# Patient Record
Sex: Female | Born: 1965 | Race: White | Hispanic: No | Marital: Married | Smoking: Never smoker
Health system: Southern US, Community
[De-identification: ages and names within clinical notes are randomized; demographics above are authoritative.]

## PROBLEM LIST (undated history)

## (undated) DIAGNOSIS — Z973 Presence of spectacles and contact lenses: Secondary | ICD-10-CM

## (undated) DIAGNOSIS — R112 Nausea with vomiting, unspecified: Secondary | ICD-10-CM

## (undated) DIAGNOSIS — J45909 Unspecified asthma, uncomplicated: Secondary | ICD-10-CM

## (undated) DIAGNOSIS — Z9889 Other specified postprocedural states: Secondary | ICD-10-CM

## (undated) DIAGNOSIS — Z8489 Family history of other specified conditions: Secondary | ICD-10-CM

## (undated) DIAGNOSIS — N920 Excessive and frequent menstruation with regular cycle: Secondary | ICD-10-CM

## (undated) DIAGNOSIS — Z8632 Personal history of gestational diabetes: Secondary | ICD-10-CM

## (undated) HISTORY — PX: CERVICAL BIOPSY  W/ LOOP ELECTRODE EXCISION: SUR135

## (undated) HISTORY — PX: VAGINAL HYSTERECTOMY: SUR661

---

## 1986-04-21 HISTORY — PX: KNEE ARTHROSCOPY: SUR90

## 1993-04-21 HISTORY — PX: WISDOM TOOTH EXTRACTION: SHX21

## 1995-04-22 HISTORY — PX: REDUCTION MAMMAPLASTY: SUR839

## 2013-03-04 ENCOUNTER — Ambulatory Visit (INDEPENDENT_AMBULATORY_CARE_PROVIDER_SITE_OTHER): Payer: BC Managed Care – PPO | Admitting: Family Medicine

## 2013-03-04 ENCOUNTER — Encounter: Payer: Self-pay | Admitting: Family Medicine

## 2013-03-04 VITALS — BP 111/75 | HR 101 | Ht 61.0 in | Wt 130.0 lb

## 2013-03-04 DIAGNOSIS — M2140 Flat foot [pes planus] (acquired), unspecified foot: Secondary | ICD-10-CM

## 2013-03-04 DIAGNOSIS — M722 Plantar fascial fibromatosis: Secondary | ICD-10-CM

## 2013-03-04 NOTE — Assessment & Plan Note (Signed)
Custom orthotics made today.  Patient will continue PT, home exercises and follow up with Dr. Charlett Blake.

## 2013-03-04 NOTE — Progress Notes (Signed)
Patient ID: Mackenzie Clayton, female   DOB: July 09, 1965, 47 y.o.   MRN: [id number] 47 year old female with a long history of plantar fasciitis currently treated by Dr. Delene Loll been going to physical therapy and was referred for orthotic production. Onset of symptoms 2002.  Use orthotics in the past, made in [place], which are worn-out. Currently doing stretching exercises with physical therapy as well as at home. Symptoms have not significantly worsened however they do limit her activity of late.  Pertinent past medical history:  Negative for diabetes.  Positive for plantar fascitis  Pertinent past surgical history: Negative for foot surgery.  Social history: Nonsmoker  Review of systems as per history of present illness otherwise negative  Examination: BP 111/75  Pulse 101  Ht 5\' 1"  (1.549 m)  Wt 130 lb (58.968 kg)  BMI 24.58 kg/m2 Well-developed well-nourished 47 year old female awake alert and oriented no acute distress  Leg length is equal   Bilateral feet:  Tender to palpation of the anterior calcaneus  No evidence of effusion the posterior calcaneus  No tenderness over the Achilles tendon insertion  Normal Thompson test  No midfoot tenderness  Neutral longitudinal arches with mild collapse on standing  Loss of the transverse arch   Patient was fitted for a : semi-rigid orthotic. The orthotic was heated and afterward the patient stood on the orthotic blank positioned on the orthotic stand. The patient was positioned in subtalar neutral position and 10 degrees of ankle dorsiflexion in a weight bearing stance. After completion of molding, a stable base was applied to the orthotic blank. The blank was ground to a stable position for weight bearing. Size:  6 red eva Base:  Blue EVA medium density Posting:  none Additional orthotic padding:  none  Greater than 40 minutes of time spent in obtaining history, examination and production of custom orthotics.

## 2013-04-01 ENCOUNTER — Other Ambulatory Visit: Payer: Self-pay

## 2013-04-01 DIAGNOSIS — Z1231 Encounter for screening mammogram for malignant neoplasm of breast: Secondary | ICD-10-CM

## 2013-05-09 ENCOUNTER — Ambulatory Visit: Payer: BC Managed Care – PPO

## 2013-06-09 ENCOUNTER — Other Ambulatory Visit: Payer: Self-pay

## 2013-06-09 DIAGNOSIS — Z1231 Encounter for screening mammogram for malignant neoplasm of breast: Secondary | ICD-10-CM

## 2013-06-09 DIAGNOSIS — Z803 Family history of malignant neoplasm of breast: Secondary | ICD-10-CM

## 2013-06-11 ENCOUNTER — Encounter (HOSPITAL_COMMUNITY): Payer: Self-pay | Admitting: Emergency Medicine

## 2013-06-11 ENCOUNTER — Emergency Department (HOSPITAL_COMMUNITY)
Admission: EM | Admit: 2013-06-11 | Discharge: 2013-06-11 | Disposition: A | Discharge status: Home / Self Care | Payer: BC Managed Care – PPO | Source: Home / Self Care | Attending: Emergency Medicine | Admitting: Emergency Medicine

## 2013-06-11 DIAGNOSIS — J069 Acute upper respiratory infection, unspecified: Secondary | ICD-10-CM

## 2013-06-11 MED ORDER — HYDROCOD POLST-CHLORPHEN POLST 10-8 MG/5ML PO LQCR: 5.0000 mL | Freq: Two times a day (BID) | ORAL | Status: DC | PRN

## 2013-06-11 MED ORDER — PREDNISONE 20 MG PO TABS: 20.0000 mg | ORAL_TABLET | Freq: Two times a day (BID) | ORAL | Status: DC

## 2013-06-11 MED ORDER — AMOXICILLIN-POT CLAVULANATE 875-125 MG PO TABS: 1.0000 | ORAL_TABLET | Freq: Two times a day (BID) | ORAL | Status: DC

## 2013-06-11 MED ORDER — ALBUTEROL SULFATE HFA 108 (90 BASE) MCG/ACT IN AERS: 2.0000 | INHALATION_SPRAY | Freq: Four times a day (QID) | RESPIRATORY_TRACT | Status: DC

## 2013-06-11 NOTE — ED Notes (Signed)
Pt  Has  Symptoms   Of   Cough   /  Congested        With    Chills         With  The symptoms  Staring       sev  Days   Ago        -    The  Symptoms  Not  releived  By otc  meds            The  Pt  Is  Sitting  Upright on  Exam  Table    Speaking   In  Complete  sentances

## 2013-06-11 NOTE — ED Provider Notes (Signed)
  Chief Complaint   Chief Complaint  Patient presents with  . URI    History of Present Illness   Mackenzie Clayton is a 48 year old female who has had a three-day history of cough productive of small amounts of sputum, chest tightness, chills, posttussive vomiting, hoarseness, sore throat, nasal congestion with clear rhinorrhea, and headache. She denies any fever. No obvious sick exposure.  Review of Systems   Other than as noted above, the patient denies any of the following symptoms: Systemic:  No fevers, chills, sweats, or myalgias. Eye:  No redness or discharge. ENT:  No ear pain, headache, nasal congestion, drainage, sinus pressure, or sore throat. Neck:  No neck pain, stiffness, or swollen glands. Lungs:  No cough, sputum production, hemoptysis, wheezing, chest tightness, shortness of breath or chest pain. GI:  No abdominal pain, nausea, vomiting or diarrhea.  Dane   Past medical history, family history, social history, meds, and allergies were reviewed.   Physical exam   Vital signs:  BP 119/73  Pulse 117  Temp(Src) 98.7 F (37.1 C) (Oral)  Resp 20  SpO2 98%  LMP 06/11/2013 General:  Alert and oriented.  In no distress.  Skin warm and dry. Eye:  No conjunctival injection or drainage. Lids were normal. ENT:  TMs and canals were normal, without erythema or inflammation.  Nasal mucosa was clear and uncongested, without drainage.  Mucous membranes were moist.  Pharynx was clear with no exudate or drainage.  There were no oral ulcerations or lesions. Neck:  Supple, no adenopathy, tenderness or mass. Lungs:  No respiratory distress.  Lungs were clear to auscultation, without wheezes, rales or rhonchi.  Breath sounds were clear and equal bilaterally.  Heart:  Regular rhythm, without gallops, murmers or rubs. Skin:  Clear, warm, and dry, without rash or lesions.  Plan    1.  Meds:  The following meds were prescribed:   Discharge Medication List as of 06/11/2013  1:10 PM     START taking these medications   Details  albuterol (PROVENTIL HFA;VENTOLIN HFA) 108 (90 BASE) MCG/ACT inhaler Inhale 2 puffs into the lungs 4 (four) times daily., Starting 06/11/2013, Until Discontinued, Normal    amoxicillin-clavulanate (AUGMENTIN) 875-125 MG per tablet Take 1 tablet by mouth 2 (two) times daily., Starting 06/11/2013, Until Discontinued, Print    chlorpheniramine-HYDROcodone (TUSSIONEX) 10-8 MG/5ML LQCR Take 5 mLs by mouth every 12 (twelve) hours as needed for cough., Starting 06/11/2013, Until Discontinued, Normal    predniSONE (DELTASONE) 20 MG tablet Take 1 tablet (20 mg total) by mouth 2 (two) times daily., Starting 06/11/2013, Until Discontinued, Normal        2.  Patient Education/Counseling:  The patient was given appropriate handouts, self care instructions, and instructed in symptomatic relief.  Instructed to get extra fluids, rest, and use a cool mist vaporizer.  Patient was told to get the antibiotic prescription filled if no better in 2-3 days.  3.  Follow up:  The patient was told to follow up here if no better in 3 to 4 days, or sooner if becoming worse in any way, and given some red flag symptoms such as increasing fever, difficulty breathing, chest pain, or persistent vomiting which would prompt immediate return.  Follow up here as needed.      Harden Mo, MD 06/11/13 [id number]

## 2013-06-11 NOTE — Discharge Instructions (Signed)

## 2013-07-28 ENCOUNTER — Other Ambulatory Visit: Payer: Self-pay

## 2013-07-28 ENCOUNTER — Other Ambulatory Visit: Payer: Self-pay | Admitting: *Deleted

## 2013-07-28 DIAGNOSIS — Z803 Family history of malignant neoplasm of breast: Secondary | ICD-10-CM

## 2013-07-28 DIAGNOSIS — N6019 Diffuse cystic mastopathy of unspecified breast: Secondary | ICD-10-CM

## 2013-08-02 ENCOUNTER — Other Ambulatory Visit: Payer: Self-pay | Admitting: *Deleted

## 2013-08-02 DIAGNOSIS — N6019 Diffuse cystic mastopathy of unspecified breast: Secondary | ICD-10-CM

## 2013-08-08 ENCOUNTER — Ambulatory Visit
Admission: RE | Admit: 2013-08-08 | Discharge: 2013-08-08 | Disposition: A | Discharge status: Home / Self Care | Payer: BC Managed Care – PPO | Source: Ambulatory Visit | Attending: *Deleted | Admitting: *Deleted

## 2013-08-08 DIAGNOSIS — N6019 Diffuse cystic mastopathy of unspecified breast: Secondary | ICD-10-CM

## 2013-08-23 ENCOUNTER — Other Ambulatory Visit: Payer: Self-pay | Admitting: Obstetrics and Gynecology

## 2013-08-23 DIAGNOSIS — N6019 Diffuse cystic mastopathy of unspecified breast: Secondary | ICD-10-CM

## 2014-07-28 ENCOUNTER — Other Ambulatory Visit: Payer: Self-pay

## 2014-07-28 DIAGNOSIS — Z1231 Encounter for screening mammogram for malignant neoplasm of breast: Secondary | ICD-10-CM

## 2014-08-18 ENCOUNTER — Ambulatory Visit
Admission: RE | Admit: 2014-08-18 | Discharge: 2014-08-18 | Disposition: A | Discharge status: Home / Self Care | Payer: BLUE CROSS/BLUE SHIELD | Source: Ambulatory Visit

## 2014-08-18 DIAGNOSIS — Z1231 Encounter for screening mammogram for malignant neoplasm of breast: Secondary | ICD-10-CM

## 2014-09-08 ENCOUNTER — Other Ambulatory Visit: Payer: Self-pay | Admitting: Obstetrics and Gynecology

## 2014-09-08 DIAGNOSIS — Z803 Family history of malignant neoplasm of breast: Secondary | ICD-10-CM

## 2014-10-30 NOTE — H&P (Signed)
  49 year old G5 P5 with menorrhagia.  Bleeding did not respond to the Provera. Sometimes heavy bleeding with passage of silver dollar sized clotting.  Ultrasound in office suggestive of adenomyosis and small intramural fibroid.  No past medical history on file.  Past Surgical History  Procedure Laterality Date  . Knee arthroscopy     Prior to Admission medications   Medication Sig Start Date End Date Taking? Authorizing Provider  albuterol (PROVENTIL HFA;VENTOLIN HFA) 108 (90 BASE) MCG/ACT inhaler Inhale 2 puffs into the lungs 4 (four) times daily. 06/11/13   Harden Mo, MD  amoxicillin-clavulanate (AUGMENTIN) 875-125 MG per tablet Take 1 tablet by mouth 2 (two) times daily. 06/11/13   Harden Mo, MD  chlorpheniramine-HYDROcodone (TUSSIONEX) 10-8 MG/5ML LQCR Take 5 mLs by mouth every 12 (twelve) hours as needed for cough. 06/11/13   Harden Mo, MD  meloxicam (MOBIC) 15 MG tablet Take 15 mg by mouth daily as needed for pain.    Historical Provider, MD  predniSONE (DELTASONE) 20 MG tablet Take 1 tablet (20 mg total) by mouth 2 (two) times daily. 06/11/13   Harden Mo, MD   Allergies:  NKDA  No family history on file. History   Social History  . Marital Status: Married    Spouse Name: N/A  . Number of Children: N/A  . Years of Education: N/A   Occupational History  . Not on file.   Social History Main Topics  . Smoking status: Never Smoker   . Smokeless tobacco: Never Used  . Alcohol Use: No  . Drug Use: Not on file  . Sexual Activity: Not on file   Other Topics Concern  . Not on file   Social History Narrative  . No narrative on file   Review of Systems  All other systems reviewed and are negative.  There were no vitals taken for this visit. General alert and oriented Lung CTAB Car RRR Abdomen is soft and non tender Pelvic Vagina appears normal.  Cervix no lesions. Good descensus. Uterus is slightly enlarged.  Adnexae non tender no  masses  IMPRESSION: Menorrhagia  PLAN: Lavh and bso Risks reviewed Consent signed

## 2014-11-02 ENCOUNTER — Encounter (HOSPITAL_BASED_OUTPATIENT_CLINIC_OR_DEPARTMENT_OTHER): Payer: Self-pay | Admitting: *Deleted

## 2014-11-02 NOTE — Progress Notes (Signed)
NPO AFTER MN.  ARRIVE AT 0600.  NEEDS CBC AND T & S.  WILL TAKE ALLEGRA AND SINGULAIR AM DOS W/ SIPS OF WATER.

## 2014-11-06 ENCOUNTER — Ambulatory Visit (HOSPITAL_BASED_OUTPATIENT_CLINIC_OR_DEPARTMENT_OTHER): Payer: BLUE CROSS/BLUE SHIELD | Admitting: Anesthesiology

## 2014-11-06 ENCOUNTER — Encounter (HOSPITAL_BASED_OUTPATIENT_CLINIC_OR_DEPARTMENT_OTHER)
Admission: RE | Disposition: A | Discharge status: Home / Self Care | Payer: Self-pay | Source: Ambulatory Visit | Attending: Obstetrics and Gynecology

## 2014-11-06 ENCOUNTER — Encounter (HOSPITAL_BASED_OUTPATIENT_CLINIC_OR_DEPARTMENT_OTHER): Payer: Self-pay | Admitting: *Deleted

## 2014-11-06 ENCOUNTER — Ambulatory Visit (HOSPITAL_BASED_OUTPATIENT_CLINIC_OR_DEPARTMENT_OTHER)
Admission: RE | Admit: 2014-11-06 | Discharge: 2014-11-07 | Disposition: A | Discharge status: Home / Self Care | Payer: BLUE CROSS/BLUE SHIELD | Source: Ambulatory Visit | Attending: Obstetrics and Gynecology | Admitting: Obstetrics and Gynecology

## 2014-11-06 DIAGNOSIS — N72 Inflammatory disease of cervix uteri: Secondary | ICD-10-CM | POA: Insufficient documentation

## 2014-11-06 DIAGNOSIS — J45909 Unspecified asthma, uncomplicated: Secondary | ICD-10-CM | POA: Diagnosis not present

## 2014-11-06 DIAGNOSIS — N9489 Other specified conditions associated with female genital organs and menstrual cycle: Secondary | ICD-10-CM | POA: Insufficient documentation

## 2014-11-06 DIAGNOSIS — Z79899 Other long term (current) drug therapy: Secondary | ICD-10-CM | POA: Diagnosis not present

## 2014-11-06 DIAGNOSIS — N920 Excessive and frequent menstruation with regular cycle: Secondary | ICD-10-CM | POA: Diagnosis present

## 2014-11-06 DIAGNOSIS — Z7951 Long term (current) use of inhaled steroids: Secondary | ICD-10-CM | POA: Insufficient documentation

## 2014-11-06 DIAGNOSIS — N830 Follicular cyst of ovary: Secondary | ICD-10-CM | POA: Diagnosis not present

## 2014-11-06 DIAGNOSIS — N800 Endometriosis of uterus: Secondary | ICD-10-CM | POA: Insufficient documentation

## 2014-11-06 HISTORY — DX: Nausea with vomiting, unspecified: R11.2

## 2014-11-06 HISTORY — PX: SALPINGOOPHORECTOMY: SHX82

## 2014-11-06 HISTORY — PX: LAPAROSCOPIC ASSISTED VAGINAL HYSTERECTOMY: SHX5398

## 2014-11-06 HISTORY — DX: Unspecified asthma, uncomplicated: J45.909

## 2014-11-06 HISTORY — DX: Personal history of gestational diabetes: Z86.32

## 2014-11-06 HISTORY — DX: Excessive and frequent menstruation with regular cycle: N92.0

## 2014-11-06 HISTORY — DX: Other specified postprocedural states: Z98.890

## 2014-11-06 HISTORY — DX: Presence of spectacles and contact lenses: Z97.3

## 2014-11-06 LAB — TYPE AND SCREEN
ABO/RH(D): O POS
Antibody Screen: NEGATIVE

## 2014-11-06 LAB — CBC
HEMATOCRIT: 40.5 % (ref 36.0–46.0)
Hemoglobin: 13.2 g/dL (ref 12.0–15.0)
MCH: 27.8 pg (ref 26.0–34.0)
MCHC: 32.6 g/dL (ref 30.0–36.0)
MCV: 85.4 fL (ref 78.0–100.0)
PLATELETS: 232 10*3/uL (ref 150–400)
RBC: 4.74 MIL/uL (ref 3.87–5.11)
RDW: 12.6 % (ref 11.5–15.5)
WBC: 5.3 10*3/uL (ref 4.0–10.5)

## 2014-11-06 LAB — ABO/RH: ABO/RH(D): O POS

## 2014-11-06 SURGERY — HYSTERECTOMY, VAGINAL, LAPAROSCOPY-ASSISTED
Anesthesia: General | Site: Abdomen

## 2014-11-06 MED ORDER — ONDANSETRON HCL 4 MG/2ML IJ SOLN
INTRAMUSCULAR | Status: DC | PRN
  Administered 2014-11-06: 4 mg via INTRAVENOUS

## 2014-11-06 MED ORDER — HYDROMORPHONE HCL 1 MG/ML IJ SOLN
INTRAMUSCULAR | Status: AC
  Filled 2014-11-06: qty 1

## 2014-11-06 MED ORDER — PROMETHAZINE HCL 25 MG/ML IJ SOLN
6.2500 mg | INTRAMUSCULAR | Status: DC | PRN
  Administered 2014-11-06: 6.25 mg via INTRAVENOUS
  Filled 2014-11-06: qty 1

## 2014-11-06 MED ORDER — OXYCODONE HCL 5 MG/5ML PO SOLN
5.0000 mg | Freq: Once | ORAL | Status: AC | PRN
  Filled 2014-11-06: qty 5

## 2014-11-06 MED ORDER — HYDROMORPHONE 0.3 MG/ML IV SOLN
INTRAVENOUS | Status: DC
  Administered 2014-11-06: 12:00:00 via INTRAVENOUS
  Filled 2014-11-06 (×2): qty 25

## 2014-11-06 MED ORDER — MIDAZOLAM HCL 2 MG/2ML IJ SOLN
INTRAMUSCULAR | Status: AC
  Filled 2014-11-06: qty 2

## 2014-11-06 MED ORDER — BUPIVACAINE HCL (PF) 0.25 % IJ SOLN
INTRAMUSCULAR | Status: DC | PRN
  Administered 2014-11-06: 5 mL

## 2014-11-06 MED ORDER — FENTANYL CITRATE (PF) 100 MCG/2ML IJ SOLN
INTRAMUSCULAR | Status: AC
  Filled 2014-11-06: qty 4

## 2014-11-06 MED ORDER — OXYCODONE HCL 5 MG PO TABS
ORAL_TABLET | ORAL | Status: AC
  Filled 2014-11-06: qty 1

## 2014-11-06 MED ORDER — DEXAMETHASONE SODIUM PHOSPHATE 4 MG/ML IJ SOLN
INTRAMUSCULAR | Status: DC | PRN
  Administered 2014-11-06: 10 mg via INTRAVENOUS

## 2014-11-06 MED ORDER — PROPOFOL 10 MG/ML IV BOLUS
INTRAVENOUS | Status: DC | PRN
Start: 2014-11-06 — End: 2014-11-06
  Administered 2014-11-06: 200 mg via INTRAVENOUS

## 2014-11-06 MED ORDER — PROMETHAZINE HCL 25 MG/ML IJ SOLN
INTRAMUSCULAR | Status: AC
  Filled 2014-11-06: qty 1

## 2014-11-06 MED ORDER — FENTANYL CITRATE (PF) 100 MCG/2ML IJ SOLN
INTRAMUSCULAR | Status: DC | PRN
  Administered 2014-11-06: 100 ug via INTRAVENOUS
  Administered 2014-11-06: 50 ug via INTRAVENOUS

## 2014-11-06 MED ORDER — MONTELUKAST SODIUM 10 MG PO TABS
10.0000 mg | ORAL_TABLET | Freq: Every morning | ORAL | Status: DC
  Filled 2014-11-06: qty 1

## 2014-11-06 MED ORDER — ONDANSETRON HCL 4 MG/2ML IJ SOLN
4.0000 mg | Freq: Four times a day (QID) | INTRAMUSCULAR | Status: DC | PRN
  Filled 2014-11-06: qty 2

## 2014-11-06 MED ORDER — NALOXONE HCL 0.4 MG/ML IJ SOLN
0.4000 mg | INTRAMUSCULAR | Status: DC | PRN
  Filled 2014-11-06: qty 1

## 2014-11-06 MED ORDER — SCOPOLAMINE 1 MG/3DAYS TD PT72
MEDICATED_PATCH | TRANSDERMAL | Status: AC
  Filled 2014-11-06: qty 1

## 2014-11-06 MED ORDER — LIDOCAINE HCL (CARDIAC) 20 MG/ML IV SOLN
INTRAVENOUS | Status: DC | PRN
  Administered 2014-11-06: 100 mg via INTRAVENOUS

## 2014-11-06 MED ORDER — PROMETHAZINE HCL 25 MG/ML IJ SOLN
12.5000 mg | Freq: Four times a day (QID) | INTRAMUSCULAR | Status: DC | PRN
Start: 2014-11-06 — End: 2014-11-07
  Filled 2014-11-06: qty 1

## 2014-11-06 MED ORDER — CEFAZOLIN SODIUM-DEXTROSE 2-3 GM-% IV SOLR
2.0000 g | INTRAVENOUS | Status: AC
  Administered 2014-11-06: 2 g via INTRAVENOUS
  Filled 2014-11-06: qty 50

## 2014-11-06 MED ORDER — LACTATED RINGERS IV SOLN
INTRAVENOUS | Status: DC
  Administered 2014-11-06: 07:00:00 via INTRAVENOUS
  Filled 2014-11-06: qty 1000

## 2014-11-06 MED ORDER — MIDAZOLAM HCL 5 MG/5ML IJ SOLN
INTRAMUSCULAR | Status: DC | PRN
  Administered 2014-11-06: 2 mg via INTRAVENOUS

## 2014-11-06 MED ORDER — SODIUM CHLORIDE 0.9 % IJ SOLN
9.0000 mL | INTRAMUSCULAR | Status: DC | PRN
  Filled 2014-11-06: qty 9

## 2014-11-06 MED ORDER — LACTATED RINGERS IV SOLN
INTRAVENOUS | Status: DC
  Administered 2014-11-06 (×2): via INTRAVENOUS
  Filled 2014-11-06: qty 1000

## 2014-11-06 MED ORDER — NEOSTIGMINE METHYLSULFATE 10 MG/10ML IV SOLN
INTRAVENOUS | Status: DC | PRN
  Administered 2014-11-06: 3 mg via INTRAVENOUS

## 2014-11-06 MED ORDER — KETOROLAC TROMETHAMINE 30 MG/ML IJ SOLN
INTRAMUSCULAR | Status: AC
  Filled 2014-11-06: qty 1

## 2014-11-06 MED ORDER — KETOROLAC TROMETHAMINE 30 MG/ML IJ SOLN
30.0000 mg | Freq: Once | INTRAMUSCULAR | Status: AC
  Administered 2014-11-06: 30 mg via INTRAVENOUS
  Filled 2014-11-06: qty 1

## 2014-11-06 MED ORDER — GLYCOPYRROLATE 0.2 MG/ML IJ SOLN
INTRAMUSCULAR | Status: DC | PRN
  Administered 2014-11-06: 0.4 mg via INTRAVENOUS

## 2014-11-06 MED ORDER — HYDROMORPHONE HCL 1 MG/ML IJ SOLN
1.0000 mg | INTRAMUSCULAR | Status: DC | PRN
Start: 2014-11-06 — End: 2014-11-07
  Filled 2014-11-06: qty 1

## 2014-11-06 MED ORDER — KETOROLAC TROMETHAMINE 30 MG/ML IJ SOLN
30.0000 mg | Freq: Once | INTRAMUSCULAR | Status: AC | PRN
  Filled 2014-11-06: qty 1

## 2014-11-06 MED ORDER — SCOPOLAMINE 1 MG/3DAYS TD PT72
MEDICATED_PATCH | TRANSDERMAL | Status: DC | PRN
  Administered 2014-11-06: 1 via TRANSDERMAL

## 2014-11-06 MED ORDER — HYDROMORPHONE HCL 1 MG/ML IJ SOLN
0.2500 mg | INTRAMUSCULAR | Status: DC | PRN
  Administered 2014-11-06 (×4): 0.5 mg via INTRAVENOUS
  Administered 2014-11-06: 0.25 mg via INTRAVENOUS
  Filled 2014-11-06: qty 1

## 2014-11-06 MED ORDER — CEFAZOLIN SODIUM-DEXTROSE 2-3 GM-% IV SOLR
INTRAVENOUS | Status: AC
  Filled 2014-11-06: qty 50

## 2014-11-06 MED ORDER — LACTATED RINGERS IR SOLN
Status: DC | PRN
  Administered 2014-11-06: 3000 mL

## 2014-11-06 MED ORDER — ALBUTEROL SULFATE HFA 108 (90 BASE) MCG/ACT IN AERS
1.0000 | INHALATION_SPRAY | Freq: Four times a day (QID) | RESPIRATORY_TRACT | Status: DC | PRN
  Filled 2014-11-06: qty 6.7

## 2014-11-06 MED ORDER — ACETAMINOPHEN 10 MG/ML IV SOLN
INTRAVENOUS | Status: DC | PRN
  Administered 2014-11-06: 1000 mg via INTRAVENOUS

## 2014-11-06 MED ORDER — SUCCINYLCHOLINE CHLORIDE 20 MG/ML IJ SOLN
INTRAMUSCULAR | Status: DC | PRN
  Administered 2014-11-06: 100 mg via INTRAVENOUS

## 2014-11-06 MED ORDER — ROCURONIUM BROMIDE 100 MG/10ML IV SOLN
INTRAVENOUS | Status: DC | PRN
  Administered 2014-11-06: 30 mg via INTRAVENOUS

## 2014-11-06 MED ORDER — LACTATED RINGERS IV SOLN
INTRAVENOUS | Status: DC
  Administered 2014-11-06: 15:00:00 via INTRAVENOUS
  Filled 2014-11-06: qty 1000

## 2014-11-06 MED ORDER — DIPHENHYDRAMINE HCL 50 MG/ML IJ SOLN
12.5000 mg | Freq: Four times a day (QID) | INTRAMUSCULAR | Status: DC | PRN
  Filled 2014-11-06: qty 0.25

## 2014-11-06 MED ORDER — TRAMADOL HCL 50 MG PO TABS
ORAL_TABLET | ORAL | Status: AC
  Filled 2014-11-06: qty 1

## 2014-11-06 MED ORDER — DIPHENHYDRAMINE HCL 12.5 MG/5ML PO ELIX
12.5000 mg | ORAL_SOLUTION | Freq: Four times a day (QID) | ORAL | Status: DC | PRN
  Filled 2014-11-06: qty 5

## 2014-11-06 MED ORDER — MENTHOL 3 MG MT LOZG
1.0000 | LOZENGE | OROMUCOSAL | Status: DC | PRN
  Filled 2014-11-06: qty 9

## 2014-11-06 MED ORDER — IBUPROFEN 600 MG PO TABS
600.0000 mg | ORAL_TABLET | Freq: Four times a day (QID) | ORAL | Status: DC | PRN
  Administered 2014-11-07: 600 mg via ORAL
  Filled 2014-11-06: qty 1

## 2014-11-06 MED ORDER — PHENYLEPHRINE HCL 10 MG/ML IJ SOLN
INTRAMUSCULAR | Status: DC | PRN
  Administered 2014-11-06 (×2): 80 ug via INTRAVENOUS

## 2014-11-06 MED ORDER — OXYCODONE HCL 5 MG PO TABS
5.0000 mg | ORAL_TABLET | Freq: Once | ORAL | Status: AC | PRN
  Administered 2014-11-06: 5 mg via ORAL
  Filled 2014-11-06: qty 1

## 2014-11-06 MED ORDER — TRAMADOL HCL 50 MG PO TABS
50.0000 mg | ORAL_TABLET | Freq: Four times a day (QID) | ORAL | Status: DC | PRN
  Administered 2014-11-07: 50 mg via ORAL
  Filled 2014-11-06: qty 1

## 2014-11-06 SURGICAL SUPPLY — 75 items
APPLICATOR COTTON TIP 6IN STRL (MISCELLANEOUS) ×4 IMPLANT
BAG URINE DRAINAGE (UROLOGICAL SUPPLIES) ×4 IMPLANT
BANDAGE ADHESIVE 1X3 (GAUZE/BANDAGES/DRESSINGS) IMPLANT
BARRIER ADHS 3X4 INTERCEED (GAUZE/BANDAGES/DRESSINGS) IMPLANT
BLADE CLIPPER SURG (BLADE) ×4 IMPLANT
BLADE SURG 11 STRL SS (BLADE) ×4 IMPLANT
CANISTER SUCTION 2500CC (MISCELLANEOUS) IMPLANT
CATH FOLEY 2WAY SLVR  5CC 14FR (CATHETERS) ×2
CATH FOLEY 2WAY SLVR 5CC 14FR (CATHETERS) ×2 IMPLANT
CHLORAPREP W/TINT 26ML (MISCELLANEOUS) ×4 IMPLANT
CLOSURE WOUND 1/4X4 (GAUZE/BANDAGES/DRESSINGS)
COVER BACK TABLE 60X90IN (DRAPES) ×8 IMPLANT
DRAPE LG THREE QUARTER DISP (DRAPES) ×4 IMPLANT
DRAPE UNDERBUTTOCKS STRL (DRAPE) ×4 IMPLANT
ELECT REM PT RETURN 9FT ADLT (ELECTROSURGICAL) ×4
ELECTRODE REM PT RTRN 9FT ADLT (ELECTROSURGICAL) ×2 IMPLANT
FILTER SMOKE EVAC LAPAROSHD (FILTER) IMPLANT
GLOVE BIO SURGEON STRL SZ 6.5 (GLOVE) ×15 IMPLANT
GLOVE BIO SURGEON STRL SZ7 (GLOVE) ×4 IMPLANT
GLOVE BIO SURGEONS STRL SZ 6.5 (GLOVE) ×5
GLOVE BIOGEL PI IND STRL 6.5 (GLOVE) ×4 IMPLANT
GLOVE BIOGEL PI IND STRL 7.5 (GLOVE) ×2 IMPLANT
GLOVE BIOGEL PI INDICATOR 6.5 (GLOVE) ×4
GLOVE BIOGEL PI INDICATOR 7.5 (GLOVE) ×2
GOWN STRL REUS W/ TWL LRG LVL3 (GOWN DISPOSABLE) ×2 IMPLANT
GOWN STRL REUS W/TWL LRG LVL3 (GOWN DISPOSABLE) ×14 IMPLANT
GOWN STRL REUS W/TWL XL LVL3 (GOWN DISPOSABLE) ×4 IMPLANT
HOLDER FOLEY CATH W/STRAP (MISCELLANEOUS) ×4 IMPLANT
LIQUID BAND (GAUZE/BANDAGES/DRESSINGS) ×4 IMPLANT
MANIFOLD NEPTUNE II (INSTRUMENTS) IMPLANT
NEEDLE HYPO 25X1 1.5 SAFETY (NEEDLE) ×4 IMPLANT
NEEDLE INSUFFLATION 14GA 120MM (NEEDLE) IMPLANT
NEEDLE INSUFFLATION 14GA 150MM (NEEDLE) IMPLANT
NEEDLE SPNL 22GX3.5 QUINCKE BK (NEEDLE) ×4 IMPLANT
NS IRRIG 500ML POUR BTL (IV SOLUTION) ×4 IMPLANT
PACK BASIN DAY SURGERY FS (CUSTOM PROCEDURE TRAY) ×4 IMPLANT
PAD OB MATERNITY 4.3X12.25 (PERSONAL CARE ITEMS) ×4 IMPLANT
PAD PREP 24X48 CUFFED NSTRL (MISCELLANEOUS) ×4 IMPLANT
PADDING ION DISPOSABLE (MISCELLANEOUS) ×8 IMPLANT
PENCIL BUTTON HOLSTER BLD 10FT (ELECTRODE) ×4 IMPLANT
POUCH SPECIMEN RETRIEVAL 10MM (ENDOMECHANICALS) IMPLANT
SCISSORS LAP 5X35 DISP (ENDOMECHANICALS) IMPLANT
SEALER TISSUE G2 CVD JAW 45CM (ENDOMECHANICALS) ×4 IMPLANT
SET IRRIG TUBING LAPAROSCOPIC (IRRIGATION / IRRIGATOR) ×4 IMPLANT
SHEET LAVH (DRAPES) ×4 IMPLANT
SOLUTION ANTI FOG 6CC (MISCELLANEOUS) ×4 IMPLANT
SOLUTION ELECTROLUBE (MISCELLANEOUS) ×4 IMPLANT
SPONGE LAP 4X18 X RAY DECT (DISPOSABLE) ×4 IMPLANT
STRIP CLOSURE SKIN 1/4X4 (GAUZE/BANDAGES/DRESSINGS) IMPLANT
SUT VIC AB 0 CT1 18XCR BRD 8 (SUTURE) ×4 IMPLANT
SUT VIC AB 0 CT1 36 (SUTURE) ×12 IMPLANT
SUT VIC AB 0 CT1 8-18 (SUTURE) ×4
SUT VIC AB 3-0 PS2 18 (SUTURE) ×2
SUT VIC AB 3-0 PS2 18XBRD (SUTURE) ×2 IMPLANT
SUT VIC AB 3-0 SH 27 (SUTURE)
SUT VIC AB 3-0 SH 27X BRD (SUTURE) IMPLANT
SUT VICRYL 0 TIES 12 18 (SUTURE) ×4 IMPLANT
SUT VICRYL 0 UR6 27IN ABS (SUTURE) ×4 IMPLANT
SYR 3ML 23GX1 SAFETY (SYRINGE) IMPLANT
SYR BULB IRRIGATION 50ML (SYRINGE) ×4 IMPLANT
SYR CONTROL 10ML LL (SYRINGE) ×4 IMPLANT
SYRINGE 10CC LL (SYRINGE) ×4 IMPLANT
TOWEL NATURAL 6PK STERILE (DISPOSABLE) ×8 IMPLANT
TOWEL OR 17X24 6PK STRL BLUE (TOWEL DISPOSABLE) ×8 IMPLANT
TRAY DSU PREP LF (CUSTOM PROCEDURE TRAY) ×4 IMPLANT
TROCAR OPTI TIP 5M 100M (ENDOMECHANICALS) ×4 IMPLANT
TROCAR XCEL BLUNT TIP 100MML (ENDOMECHANICALS) IMPLANT
TROCAR XCEL DIL TIP R 11M (ENDOMECHANICALS) ×4 IMPLANT
TROCAR XCEL NON-BLD 11X100MML (ENDOMECHANICALS) IMPLANT
TUBE CONNECTING 12'X1/4 (SUCTIONS) ×2
TUBE CONNECTING 12X1/4 (SUCTIONS) ×6 IMPLANT
TUBING INSUFFLATION 10FT LAP (TUBING) ×4 IMPLANT
WARMER LAPAROSCOPE (MISCELLANEOUS) ×4 IMPLANT
WATER STERILE IRR 500ML POUR (IV SOLUTION) ×4 IMPLANT
YANKAUER SUCT BULB TIP NO VENT (SUCTIONS) ×4 IMPLANT

## 2014-11-06 NOTE — Progress Notes (Signed)
H and P on the chart No significant changes Will proceed with LAVH and BSO CONSENT SIGNED

## 2014-11-06 NOTE — Transfer of Care (Signed)
Immediate Anesthesia Transfer of Care Note  Patient: Mackenzie Clayton  Procedure(s) Performed: Procedure(s) with comments: LAPAROSCOPIC ASSISTED VAGINAL HYSTERECTOMY (N/A) - AND VAGINA SALPINGO OOPHORECTOMY (Bilateral) - AND VAGINA  Patient Location: PACU  Anesthesia Type:General  Level of Consciousness: awake, alert , oriented and patient cooperative  Airway & Oxygen Therapy: Patient Spontanous Breathing and Patient connected to nasal cannula oxygen  Post-op Assessment: Report given to RN and Post -op Vital signs reviewed and stable  Post vital signs: Reviewed and stable  Last Vitals:  Filed Vitals:   11/06/14 0610  BP: 127/70  Pulse: 99  Temp: 36.9 C  Resp: 16    Complications: No apparent anesthesia complications

## 2014-11-06 NOTE — Brief Op Note (Signed)
11/06/2014  9:04 AM  PATIENT:  Mackenzie Clayton  49 y.o. female  PRE-OPERATIVE DIAGNOSIS:  fibroid, menorrhagia  POST-OPERATIVE DIAGNOSIS:  fibroid, menorrhagia  PROCEDURE:  Procedure(s) with comments: LAPAROSCOPIC ASSISTED VAGINAL HYSTERECTOMY (N/A) - AND VAGINA SALPINGO OOPHORECTOMY (Bilateral) - AND VAGINA  SURGEON:  Surgeon(s) and Role:    * Dian Queen, MD - Primary    * Linda Hedges, DO - Assisting  PHYSICIAN ASSISTANT:   ASSISTANTS: none   ANESTHESIA:   general  EBL:  Total I/O In: 1000 [I.V.:1000] Out: 200 [Urine:100; Blood:100]  BLOOD ADMINISTERED:none  DRAINS: Urinary Catheter (Foley)   LOCAL MEDICATIONS USED:  LIDOCAINE   SPECIMEN:  Source of Specimen:  uterus, cervix, tubes and ovaries  DISPOSITION OF SPECIMEN:  PATHOLOGY  COUNTS:  YES  TOURNIQUET:  * No tourniquets in log *  DICTATION: .Other Dictation: Dictation Number [id number]  PLAN OF CARE: Admit for overnight observation  PATIENT DISPOSITION:  PACU - hemodynamically stable.   Delay start of Pharmacological VTE agent (>24hrs) due to surgical blood loss or risk of bleeding: not applicable

## 2014-11-06 NOTE — Progress Notes (Signed)
Witnessed dilaudid PCA waste in bin, whole syringe wasted.

## 2014-11-06 NOTE — Anesthesia Preprocedure Evaluation (Addendum)
Anesthesia Evaluation  Patient identified by MRN, date of birth, ID band Patient awake    Reviewed: Allergy & Precautions, NPO status , Patient's Chart, lab work & pertinent test results  History of Anesthesia Complications (+) PONV  Airway Mallampati: II  TM Distance: <3 FB Neck ROM: Full    Dental  (+) Teeth Intact, Dental Advisory Given   Pulmonary asthma ,  breath sounds clear to auscultation        Cardiovascular negative cardio ROS  Rhythm:Regular Rate:Normal     Neuro/Psych negative neurological ROS     GI/Hepatic negative GI ROS, Neg liver ROS,   Endo/Other  negative endocrine ROS  Renal/GU negative Renal ROS     Musculoskeletal   Abdominal   Peds  Hematology negative hematology ROS (+)   Anesthesia Other Findings   Reproductive/Obstetrics                           Anesthesia Physical Anesthesia Plan  ASA: II  Anesthesia Plan: General   Post-op Pain Management:    Induction: Intravenous  Airway Management Planned: Oral ETT  Additional Equipment:   Intra-op Plan:   Post-operative Plan: Extubation in OR  Informed Consent: I have reviewed the patients History and Physical, chart, labs and discussed the procedure including the risks, benefits and alternatives for the proposed anesthesia with the patient or authorized representative who has indicated his/her understanding and acceptance.   Dental advisory given  Plan Discussed with: CRNA  Anesthesia Plan Comments:         Anesthesia Quick Evaluation

## 2014-11-06 NOTE — Anesthesia Postprocedure Evaluation (Signed)
  Anesthesia Post-op Note  Patient: Mackenzie Clayton  Procedure(s) Performed: Procedure(s) with comments: LAPAROSCOPIC ASSISTED VAGINAL HYSTERECTOMY (N/A) - AND VAGINA SALPINGO OOPHORECTOMY (Bilateral) - AND VAGINA  Patient Location: PACU  Anesthesia Type:General  Level of Consciousness: awake and alert   Airway and Oxygen Therapy: Patient Spontanous Breathing  Post-op Pain: mild  Post-op Assessment: Post-op Vital signs reviewed              Post-op Vital Signs: Reviewed  Last Vitals:  Filed Vitals:   11/06/14 1318  BP: 114/74  Pulse: 97  Temp: 36.4 C  Resp: 14    Complications: No apparent anesthesia complications

## 2014-11-06 NOTE — OR Nursing (Signed)
Nausea has passed. Managed to eat small amount soft diet. Dangled on side of bed and tolerated well. Ambulated in hallway without difficulty.

## 2014-11-06 NOTE — Op Note (Signed)
Mackenzie Clayton, [name]:  [id number]  MEDICAL RECORD NO.:  [id number]  LOCATION:                               FACILITY:  [facility]  PHYSICIAN:  Arneisha Kincannon L. Yazmyn Valbuena, M.D.DATE OF BIRTH:  [birth year]  DATE OF PROCEDURE:  11/06/2014 DATE OF DISCHARGE:  11/07/2014                              OPERATIVE REPORT   PREOPERATIVE DIAGNOSIS:  Menorrhagia and fibroids.  POSTOPERATIVE DIAGNOSIS:  Menorrhagia and fibroids.  PROCEDURE:  LAVH and BSO.  SURGEON:  Sully Dyment L. Helane Rima, M.D.  ASSISTANT:  Dr. Lynnette Caffey.  ANESTHESIA:  General.  EBL:  200 mL.  COMPLICATIONS:  None.  PROCEDURE:  The patient was taken to the operating room.  After she had been thoroughly consented about the risk of the procedure, she was then prepped and draped in usual sterile fashion.  After she was intubated, A uterine manipulator and the Foley catheter were inserted.  Attention was turned to the abdomen where a small infraumbilical incision was made. The Veress needle was inserted.  Pneumoperitoneum was performed.  The Veress needle was then removed and an 11 mm trocar was inserted.  The abdomen and pelvis were inspected. The uterus was normal, adnexa were normal.  A 5 mm trocar was inserted under direct visualization.  An atraumatic grasper was used to lift the right tube and ovary and elevated away from the side wall.  The EnSeal was then placed across the IP ligament with careful attention to avoid the ureter which was well below.  It was cauterized and cut, and this was carried down to the round ligament with excellent hemostasis.  This was done on the left side as well in a similar fashion with careful attention to avoid the ureter as well.  We then went down to the vagina after releasing the pneumoperitoneum for this portion of the case.  Placed a weighted speculum in the vagina, the cervix was grasped with a tenaculum and a circumferential incision was made. The posterior cul-de-sac was  entered sharply using Mayo scissors.  Anterior cul-de-sac was entered sharply using Metzenbaum scissors.  Curved Heaney clamps were used to clamp the uterosacral cardinal ligaments and staying snug against the cervix and uterus.  Each pedicle was clamped, cut, and suture ligated using 0 Vicryl suture.  We then walked our way up the broad ligament by staying very snug to the cervix and uterus.  Each pedicle was clamped, cut, and suture ligated using 0 Vicryl suture.  The specimen was removed.  The remainder of the broad ligament was clamped on either side and specimen was identified as cervix, uterus, tubes, and ovaries.  The remainder of the tissue on the pedicles were tied off using a free tie of 0 Vicryl suture.  Hemostasis was very good.  We closed the posterior cuff in a running locked stitch using 0 Vicryl suture and then the cuff was closed completely anterior to posterior using 0 Vicryl and running locked stitch.  Hemostasis was very good.  We then went back up to the abdomen and irrigated the pelvis.  There was no excessive bleeding noted at all, hemostasis was excellent.  I then inspected the ureters as well as peristalsis of  the ureter x2 on the right side and x4 on the left side. The trocar for the pneumoperitoneum was released.  The trocars were removed.  The incisions were closed with Dermabond.  All sponge, lap, and instrument counts correct x2.  Urine output was clear and was approximately 100 mL.  There were no complications during this procedure.  The patient went to recovery room in stable condition.     Sheily Mackenzie Clayton L. Helane Rima, M.D.     Nevin Bloodgood  D:  11/06/2014  T:  11/06/2014  Job:  [id number]

## 2014-11-06 NOTE — Anesthesia Procedure Notes (Signed)
Procedure Name: Intubation Date/Time: 11/06/2014 7:30 AM Performed by: Wanita Chamberlain Pre-anesthesia Checklist: Patient identified, Timeout performed, Emergency Drugs available, Suction available and Patient being monitored Patient Re-evaluated:Patient Re-evaluated prior to inductionOxygen Delivery Method: Circle system utilized Preoxygenation: Pre-oxygenation with 100% oxygen Intubation Type: IV induction Ventilation: Mask ventilation without difficulty Laryngoscope Size: Mac and 3 Grade View: Grade I Tube type: Oral Tube size: 7.0 mm Number of attempts: 1 Airway Equipment and Method: Stylet Placement Confirmation: ETT inserted through vocal cords under direct vision,  positive ETCO2 and breath sounds checked- equal and bilateral Secured at: 21 cm Tube secured with: Tape Dental Injury: Teeth and Oropharynx as per pre-operative assessment

## 2014-11-06 NOTE — Progress Notes (Signed)
Dilaudid PCA d/c'd.  None used.  Total cartridge wasted in  Med bin.  Witnessed by Hinton Dyer, RN

## 2014-11-07 ENCOUNTER — Encounter (HOSPITAL_BASED_OUTPATIENT_CLINIC_OR_DEPARTMENT_OTHER): Payer: Self-pay | Admitting: Obstetrics and Gynecology

## 2014-11-07 DIAGNOSIS — N800 Endometriosis of uterus: Secondary | ICD-10-CM | POA: Diagnosis not present

## 2014-11-07 LAB — CBC
HEMATOCRIT: 35.9 % — AB (ref 36.0–46.0)
HEMOGLOBIN: 11.6 g/dL — AB (ref 12.0–15.0)
MCH: 27.9 pg (ref 26.0–34.0)
MCHC: 32.3 g/dL (ref 30.0–36.0)
MCV: 86.3 fL (ref 78.0–100.0)
PLATELETS: 229 10*3/uL (ref 150–400)
RBC: 4.16 MIL/uL (ref 3.87–5.11)
RDW: 12.9 % (ref 11.5–15.5)
WBC: 11.4 10*3/uL — ABNORMAL HIGH (ref 4.0–10.5)

## 2014-11-07 LAB — BASIC METABOLIC PANEL
ANION GAP: 4 — AB (ref 5–15)
BUN: 13 mg/dL (ref 6–20)
CALCIUM: 9.1 mg/dL (ref 8.9–10.3)
CO2: 28 mmol/L (ref 22–32)
CREATININE: 0.69 mg/dL (ref 0.44–1.00)
Chloride: 107 mmol/L (ref 101–111)
GFR calc Af Amer: >60 mL/min (ref >60)
GFR calc non Af Amer: >60 mL/min (ref >60)
Glucose, Bld: 106 mg/dL — ABNORMAL HIGH (ref 65–99)
Potassium: 4 mmol/L (ref 3.5–5.1)
Sodium: 139 mmol/L (ref 135–145)

## 2014-11-07 MED ORDER — TRAMADOL HCL 50 MG PO TABS
ORAL_TABLET | ORAL | Status: AC
  Filled 2014-11-07: qty 1

## 2014-11-07 MED ORDER — TRAMADOL HCL 50 MG PO TABS: 50.0000 mg | ORAL_TABLET | Freq: Four times a day (QID) | ORAL | Status: DC | PRN

## 2014-11-07 MED ORDER — IBUPROFEN 600 MG PO TABS: 600.0000 mg | ORAL_TABLET | Freq: Four times a day (QID) | ORAL | Status: AC | PRN

## 2014-11-07 MED ORDER — IBUPROFEN 200 MG PO TABS
ORAL_TABLET | ORAL | Status: AC
  Filled 2014-11-07: qty 3

## 2014-11-07 NOTE — Discharge Instructions (Signed)

## 2014-11-07 NOTE — Progress Notes (Signed)
Patient is doing well.  BP 120/70 mmHg  Pulse 98  Temp(Src) 97.5 F (36.4 C) (Oral)  Resp 18  Ht 5\' 2"  (1.575 m)  Wt 62.143 kg (137 lb)  BMI 25.05 kg/m2  SpO2 97%  LMP  (LMP Unknown) Abdomen is soft and non tender Incisions are clean and dry Results for orders placed or performed during the hospital encounter of 11/06/14 (from the past 24 hour(s))  Basic metabolic panel     Status: Abnormal   Collection Time: 11/07/14  5:10 AM  Result Value Ref Range   Sodium 139 135 - 145 mmol/L   Potassium 4.0 3.5 - 5.1 mmol/L   Chloride 107 101 - 111 mmol/L   CO2 28 22 - 32 mmol/L   Glucose, Bld 106 (H) 65 - 99 mg/dL   BUN 13 6 - 20 mg/dL   Creatinine, Ser 0.69 0.44 - 1.00 mg/dL   Calcium 9.1 8.9 - 10.3 mg/dL   GFR calc non Af Amer >60 >60 mL/min   GFR calc Af Amer >60 >60 mL/min   Anion gap 4 (L) 5 - 15  CBC     Status: Abnormal   Collection Time: 11/07/14  5:10 AM  Result Value Ref Range   WBC 11.4 (H) 4.0 - 10.5 K/uL   RBC 4.16 3.87 - 5.11 MIL/uL   Hemoglobin 11.6 (L) 12.0 - 15.0 g/dL   HCT 35.9 (L) 36.0 - 46.0 %   MCV 86.3 78.0 - 100.0 fL   MCH 27.9 26.0 - 34.0 pg   MCHC 32.3 30.0 - 36.0 g/dL   RDW 12.9 11.5 - 15.5 %   Platelets 229 150 - 400 K/uL   POD #1 Doing well Routine care  PLAN: Discharge home Rx Ultram and Ibuprofen Follow up in 1 week

## 2014-11-07 NOTE — Discharge Summary (Signed)
  Admission Diagnosis: Menorrhagia Fibroids  Discharge Diagnosis: Same  Hospital Course: 49 year old G 5 P 5 presents for treatment of menorrhagia. She underwent LAVH and BSO and did very well post op. She had a post op hemoglobin of 11 on POD # 1. She was voiding and had minimal pain on the afternoon of POD #0.  She had stable vital signs She was discharged home with ultram and ibuprofen Followup in 1 week

## 2015-01-08 ENCOUNTER — Other Ambulatory Visit: Payer: Self-pay | Admitting: Nurse Practitioner

## 2015-01-08 DIAGNOSIS — N6313 Unspecified lump in the right breast, lower outer quadrant: Secondary | ICD-10-CM

## 2015-01-08 DIAGNOSIS — N6315 Unspecified lump in the right breast, overlapping quadrants: Secondary | ICD-10-CM

## 2015-01-08 DIAGNOSIS — N6310 Unspecified lump in the right breast, unspecified quadrant: Secondary | ICD-10-CM

## 2015-01-08 DIAGNOSIS — N6325 Unspecified lump in the left breast, overlapping quadrants: Secondary | ICD-10-CM

## 2015-01-08 DIAGNOSIS — N6320 Unspecified lump in the left breast, unspecified quadrant: Secondary | ICD-10-CM

## 2015-01-11 ENCOUNTER — Ambulatory Visit
Admission: RE | Admit: 2015-01-11 | Discharge: 2015-01-11 | Disposition: A | Discharge status: Home / Self Care | Payer: BLUE CROSS/BLUE SHIELD | Source: Ambulatory Visit | Attending: Nurse Practitioner | Admitting: Nurse Practitioner

## 2015-01-11 DIAGNOSIS — N6310 Unspecified lump in the right breast, unspecified quadrant: Secondary | ICD-10-CM

## 2015-01-11 DIAGNOSIS — N6320 Unspecified lump in the left breast, unspecified quadrant: Secondary | ICD-10-CM

## 2015-01-11 DIAGNOSIS — N6315 Unspecified lump in the right breast, overlapping quadrants: Secondary | ICD-10-CM

## 2015-01-11 DIAGNOSIS — N6313 Unspecified lump in the right breast, lower outer quadrant: Secondary | ICD-10-CM

## 2015-01-11 DIAGNOSIS — N6325 Unspecified lump in the left breast, overlapping quadrants: Secondary | ICD-10-CM

## 2015-01-17 ENCOUNTER — Ambulatory Visit
Admission: RE | Admit: 2015-01-17 | Discharge: 2015-01-17 | Disposition: A | Discharge status: Home / Self Care | Payer: BLUE CROSS/BLUE SHIELD | Source: Ambulatory Visit | Attending: Obstetrics and Gynecology | Admitting: Obstetrics and Gynecology

## 2015-01-17 ENCOUNTER — Inpatient Hospital Stay
Admission: RE | Admit: 2015-01-17 | Discharge: 2015-01-17 | Disposition: A | Discharge status: Home / Self Care | Payer: Self-pay | Source: Ambulatory Visit | Attending: Obstetrics and Gynecology | Admitting: Obstetrics and Gynecology

## 2015-01-17 ENCOUNTER — Other Ambulatory Visit: Payer: Self-pay | Admitting: Obstetrics and Gynecology

## 2015-01-17 DIAGNOSIS — N63 Unspecified lump in unspecified breast: Secondary | ICD-10-CM

## 2015-01-17 DIAGNOSIS — Z803 Family history of malignant neoplasm of breast: Secondary | ICD-10-CM

## 2015-01-17 MED ORDER — GADOBENATE DIMEGLUMINE 529 MG/ML IV SOLN
12.0000 mL | Freq: Once | INTRAVENOUS | Status: AC | PRN
  Administered 2015-01-17: 12 mL via INTRAVENOUS

## 2015-01-20 HISTORY — PX: BREAST BIOPSY: SHX20

## 2015-01-23 ENCOUNTER — Other Ambulatory Visit: Payer: Self-pay | Admitting: Obstetrics and Gynecology

## 2015-01-23 DIAGNOSIS — R928 Other abnormal and inconclusive findings on diagnostic imaging of breast: Secondary | ICD-10-CM

## 2015-01-25 ENCOUNTER — Ambulatory Visit
Admission: RE | Admit: 2015-01-25 | Discharge: 2015-01-25 | Disposition: A | Discharge status: Home / Self Care | Payer: BLUE CROSS/BLUE SHIELD | Source: Ambulatory Visit | Attending: Obstetrics and Gynecology | Admitting: Obstetrics and Gynecology

## 2015-01-25 DIAGNOSIS — R928 Other abnormal and inconclusive findings on diagnostic imaging of breast: Secondary | ICD-10-CM

## 2015-01-31 ENCOUNTER — Other Ambulatory Visit: Payer: Self-pay | Admitting: Obstetrics and Gynecology

## 2015-01-31 DIAGNOSIS — N6320 Unspecified lump in the left breast, unspecified quadrant: Secondary | ICD-10-CM

## 2015-02-08 ENCOUNTER — Other Ambulatory Visit: Payer: Self-pay | Admitting: Obstetrics and Gynecology

## 2015-02-08 DIAGNOSIS — N6320 Unspecified lump in the left breast, unspecified quadrant: Secondary | ICD-10-CM

## 2015-02-09 ENCOUNTER — Ambulatory Visit
Admission: RE | Admit: 2015-02-09 | Discharge: 2015-02-09 | Disposition: A | Discharge status: Home / Self Care | Payer: BLUE CROSS/BLUE SHIELD | Source: Ambulatory Visit | Attending: Obstetrics and Gynecology | Admitting: Obstetrics and Gynecology

## 2015-02-09 DIAGNOSIS — N6320 Unspecified lump in the left breast, unspecified quadrant: Secondary | ICD-10-CM

## 2015-02-09 MED ORDER — GADOBENATE DIMEGLUMINE 529 MG/ML IV SOLN
11.0000 mL | Freq: Once | INTRAVENOUS | Status: AC | PRN
  Administered 2015-02-09: 11 mL via INTRAVENOUS

## 2015-02-12 ENCOUNTER — Other Ambulatory Visit: Payer: BLUE CROSS/BLUE SHIELD

## 2015-02-13 ENCOUNTER — Other Ambulatory Visit: Payer: BLUE CROSS/BLUE SHIELD

## 2015-03-06 ENCOUNTER — Other Ambulatory Visit: Payer: Self-pay | Admitting: General Surgery

## 2015-03-22 ENCOUNTER — Encounter (HOSPITAL_COMMUNITY): Payer: Self-pay | Admitting: Pharmacy Technician

## 2015-03-23 ENCOUNTER — Encounter (HOSPITAL_COMMUNITY): Payer: Self-pay | Admitting: *Deleted

## 2015-03-25 MED ORDER — CEFAZOLIN SODIUM-DEXTROSE 2-3 GM-% IV SOLR
2.0000 g | INTRAVENOUS | Status: AC
  Administered 2015-03-26: 2 g via INTRAVENOUS
  Filled 2015-03-25: qty 50

## 2015-03-26 ENCOUNTER — Encounter (HOSPITAL_COMMUNITY)
Admission: RE | Disposition: A | Discharge status: Home / Self Care | Payer: Self-pay | Source: Ambulatory Visit | Attending: General Surgery

## 2015-03-26 ENCOUNTER — Ambulatory Visit (HOSPITAL_COMMUNITY)
Admission: RE | Admit: 2015-03-26 | Discharge: 2015-03-27 | Disposition: A | Discharge status: Home / Self Care | Payer: BLUE CROSS/BLUE SHIELD | Source: Ambulatory Visit | Attending: General Surgery | Admitting: General Surgery

## 2015-03-26 ENCOUNTER — Ambulatory Visit (HOSPITAL_COMMUNITY): Payer: BLUE CROSS/BLUE SHIELD | Admitting: Anesthesiology

## 2015-03-26 ENCOUNTER — Encounter (HOSPITAL_COMMUNITY): Payer: Self-pay | Admitting: General Practice

## 2015-03-26 DIAGNOSIS — D241 Benign neoplasm of right breast: Secondary | ICD-10-CM | POA: Insufficient documentation

## 2015-03-26 DIAGNOSIS — Z803 Family history of malignant neoplasm of breast: Secondary | ICD-10-CM | POA: Diagnosis not present

## 2015-03-26 DIAGNOSIS — D242 Benign neoplasm of left breast: Secondary | ICD-10-CM | POA: Insufficient documentation

## 2015-03-26 HISTORY — PX: MASTECTOMY: SHX3

## 2015-03-26 HISTORY — DX: Family history of other specified conditions: Z84.89

## 2015-03-26 HISTORY — PX: SIMPLE MASTECTOMY WITH AXILLARY SENTINEL NODE BIOPSY: SHX6098

## 2015-03-26 LAB — CBC
HCT: 42.7 % (ref 36.0–46.0)
Hemoglobin: 13.6 g/dL (ref 12.0–15.0)
MCH: 26.9 pg (ref 26.0–34.0)
MCHC: 31.9 g/dL (ref 30.0–36.0)
MCV: 84.6 fL (ref 78.0–100.0)
PLATELETS: 196 10*3/uL (ref 150–400)
RBC: 5.05 MIL/uL (ref 3.87–5.11)
RDW: 13.2 % (ref 11.5–15.5)
WBC: 6.5 10*3/uL (ref 4.0–10.5)

## 2015-03-26 SURGERY — SIMPLE MASTECTOMY
Anesthesia: General | Site: Breast | Laterality: Bilateral

## 2015-03-26 MED ORDER — ALBUTEROL SULFATE HFA 108 (90 BASE) MCG/ACT IN AERS: 1.0000 | INHALATION_SPRAY | Freq: Four times a day (QID) | RESPIRATORY_TRACT | Status: DC | PRN

## 2015-03-26 MED ORDER — ALBUTEROL SULFATE (2.5 MG/3ML) 0.083% IN NEBU: 2.5000 mg | INHALATION_SOLUTION | Freq: Four times a day (QID) | RESPIRATORY_TRACT | Status: DC | PRN

## 2015-03-26 MED ORDER — NEOSTIGMINE METHYLSULFATE 10 MG/10ML IV SOLN
INTRAVENOUS | Status: DC | PRN
  Administered 2015-03-26: 3 mg via INTRAVENOUS

## 2015-03-26 MED ORDER — SCOPOLAMINE 1 MG/3DAYS TD PT72
MEDICATED_PATCH | TRANSDERMAL | Status: AC
  Administered 2015-03-26: 1.5 mg
  Filled 2015-03-26: qty 1

## 2015-03-26 MED ORDER — ONDANSETRON HCL 4 MG/2ML IJ SOLN
INTRAMUSCULAR | Status: AC
  Filled 2015-03-26: qty 2

## 2015-03-26 MED ORDER — HYDROMORPHONE HCL 1 MG/ML IJ SOLN
INTRAMUSCULAR | Status: AC
  Filled 2015-03-26: qty 1

## 2015-03-26 MED ORDER — PROMETHAZINE HCL 25 MG/ML IJ SOLN: 6.2500 mg | INTRAMUSCULAR | Status: DC | PRN

## 2015-03-26 MED ORDER — PANTOPRAZOLE SODIUM 40 MG IV SOLR
40.0000 mg | Freq: Every day | INTRAVENOUS | Status: DC
  Administered 2015-03-26: 40 mg via INTRAVENOUS
  Filled 2015-03-26: qty 40

## 2015-03-26 MED ORDER — MIDAZOLAM HCL 2 MG/2ML IJ SOLN
INTRAMUSCULAR | Status: AC
  Filled 2015-03-26: qty 2

## 2015-03-26 MED ORDER — HEPARIN SODIUM (PORCINE) 5000 UNIT/ML IJ SOLN
5000.0000 [IU] | Freq: Three times a day (TID) | INTRAMUSCULAR | Status: DC
  Administered 2015-03-27: 5000 [IU] via SUBCUTANEOUS

## 2015-03-26 MED ORDER — MONTELUKAST SODIUM 10 MG PO TABS
10.0000 mg | ORAL_TABLET | Freq: Every morning | ORAL | Status: DC
  Administered 2015-03-27: 10 mg via ORAL
  Filled 2015-03-26: qty 1

## 2015-03-26 MED ORDER — ROCURONIUM BROMIDE 50 MG/5ML IV SOLN
INTRAVENOUS | Status: AC
  Filled 2015-03-26: qty 1

## 2015-03-26 MED ORDER — DEXAMETHASONE SODIUM PHOSPHATE 4 MG/ML IJ SOLN
INTRAMUSCULAR | Status: AC
  Filled 2015-03-26: qty 2

## 2015-03-26 MED ORDER — FENTANYL CITRATE (PF) 250 MCG/5ML IJ SOLN
INTRAMUSCULAR | Status: AC
  Filled 2015-03-26: qty 5

## 2015-03-26 MED ORDER — OXYCODONE-ACETAMINOPHEN 5-325 MG PO TABS
1.0000 | ORAL_TABLET | ORAL | Status: DC | PRN
  Administered 2015-03-26: 2 via ORAL
  Administered 2015-03-27: 1 via ORAL
  Administered 2015-03-27 (×2): 2 via ORAL
  Filled 2015-03-26: qty 2
  Filled 2015-03-26: qty 1
  Filled 2015-03-26: qty 2
  Filled 2015-03-26 (×2): qty 1

## 2015-03-26 MED ORDER — HYDROMORPHONE HCL 1 MG/ML IJ SOLN
INTRAMUSCULAR | Status: DC | PRN
  Administered 2015-03-26: .3 mg via INTRAVENOUS

## 2015-03-26 MED ORDER — SUCCINYLCHOLINE CHLORIDE 20 MG/ML IJ SOLN
INTRAMUSCULAR | Status: AC
Start: 2015-03-26 — End: 2015-03-26
  Filled 2015-03-26: qty 1

## 2015-03-26 MED ORDER — LIDOCAINE HCL (CARDIAC) 20 MG/ML IV SOLN
INTRAVENOUS | Status: DC | PRN
  Administered 2015-03-26: 50 mg via INTRAVENOUS

## 2015-03-26 MED ORDER — KCL IN DEXTROSE-NACL 20-5-0.9 MEQ/L-%-% IV SOLN
INTRAVENOUS | Status: DC
  Administered 2015-03-26 – 2015-03-27 (×2): via INTRAVENOUS
  Filled 2015-03-26 (×3): qty 1000

## 2015-03-26 MED ORDER — SCOPOLAMINE 1 MG/3DAYS TD PT72
1.0000 | MEDICATED_PATCH | TRANSDERMAL | Status: DC
  Filled 2015-03-26: qty 1

## 2015-03-26 MED ORDER — FENTANYL CITRATE (PF) 100 MCG/2ML IJ SOLN
INTRAMUSCULAR | Status: DC | PRN
  Administered 2015-03-26 (×3): 50 ug via INTRAVENOUS
  Administered 2015-03-26: 100 ug via INTRAVENOUS

## 2015-03-26 MED ORDER — MORPHINE SULFATE (PF) 2 MG/ML IV SOLN
1.0000 mg | INTRAVENOUS | Status: DC | PRN
  Administered 2015-03-26 (×3): 2 mg via INTRAVENOUS
  Filled 2015-03-26 (×3): qty 1

## 2015-03-26 MED ORDER — LACTATED RINGERS IV SOLN
INTRAVENOUS | Status: DC
  Administered 2015-03-26 (×2): via INTRAVENOUS

## 2015-03-26 MED ORDER — LIDOCAINE HCL (CARDIAC) 20 MG/ML IV SOLN
INTRAVENOUS | Status: AC
  Filled 2015-03-26: qty 5

## 2015-03-26 MED ORDER — ONDANSETRON HCL 4 MG/2ML IJ SOLN
4.0000 mg | Freq: Four times a day (QID) | INTRAMUSCULAR | Status: DC | PRN
  Administered 2015-03-26: 4 mg via INTRAVENOUS
  Filled 2015-03-26: qty 2

## 2015-03-26 MED ORDER — EPHEDRINE SULFATE 50 MG/ML IJ SOLN
INTRAMUSCULAR | Status: AC
  Filled 2015-03-26: qty 1

## 2015-03-26 MED ORDER — DEXAMETHASONE SODIUM PHOSPHATE 4 MG/ML IJ SOLN
INTRAMUSCULAR | Status: AC
  Filled 2015-03-26: qty 1

## 2015-03-26 MED ORDER — ROCURONIUM BROMIDE 100 MG/10ML IV SOLN
INTRAVENOUS | Status: DC | PRN
  Administered 2015-03-26: 50 mg via INTRAVENOUS
  Administered 2015-03-26: 15 mg via INTRAVENOUS
  Administered 2015-03-26: 5 mg via INTRAVENOUS

## 2015-03-26 MED ORDER — PROPOFOL 10 MG/ML IV BOLUS
INTRAVENOUS | Status: DC | PRN
  Administered 2015-03-26: 160 mg via INTRAVENOUS

## 2015-03-26 MED ORDER — 0.9 % SODIUM CHLORIDE (POUR BTL) OPTIME
TOPICAL | Status: DC | PRN
  Administered 2015-03-26: 1000 mL

## 2015-03-26 MED ORDER — ESMOLOL HCL 100 MG/10ML IV SOLN
INTRAVENOUS | Status: AC
  Filled 2015-03-26: qty 10

## 2015-03-26 MED ORDER — MIDAZOLAM HCL 5 MG/5ML IJ SOLN
INTRAMUSCULAR | Status: DC | PRN
  Administered 2015-03-26: 2 mg via INTRAVENOUS

## 2015-03-26 MED ORDER — HYDROMORPHONE HCL 1 MG/ML IJ SOLN
INTRAMUSCULAR | Status: AC
  Administered 2015-03-26: 0.5 mg via INTRAVENOUS
  Filled 2015-03-26: qty 1

## 2015-03-26 MED ORDER — PHENYLEPHRINE HCL 10 MG/ML IJ SOLN
10.0000 mg | INTRAVENOUS | Status: DC | PRN
  Administered 2015-03-26: 25 ug/min via INTRAVENOUS

## 2015-03-26 MED ORDER — STERILE WATER FOR INJECTION IJ SOLN
INTRAMUSCULAR | Status: AC
  Filled 2015-03-26: qty 10

## 2015-03-26 MED ORDER — ESMOLOL HCL 100 MG/10ML IV SOLN
INTRAVENOUS | Status: DC | PRN
  Administered 2015-03-26: 20 mg via INTRAVENOUS

## 2015-03-26 MED ORDER — CHLORHEXIDINE GLUCONATE 4 % EX LIQD: 1.0000 "application " | Freq: Once | CUTANEOUS | Status: DC

## 2015-03-26 MED ORDER — DEXAMETHASONE SODIUM PHOSPHATE 4 MG/ML IJ SOLN
INTRAMUSCULAR | Status: DC | PRN
  Administered 2015-03-26: 8 mg via INTRAVENOUS

## 2015-03-26 MED ORDER — GLYCOPYRROLATE 0.2 MG/ML IJ SOLN
INTRAMUSCULAR | Status: AC
  Filled 2015-03-26: qty 3

## 2015-03-26 MED ORDER — ROCURONIUM BROMIDE 50 MG/5ML IV SOLN
INTRAVENOUS | Status: AC
  Filled 2015-03-26: qty 2

## 2015-03-26 MED ORDER — NEOSTIGMINE METHYLSULFATE 10 MG/10ML IV SOLN
INTRAVENOUS | Status: AC
  Filled 2015-03-26: qty 3

## 2015-03-26 MED ORDER — HYDROMORPHONE HCL 1 MG/ML IJ SOLN
0.2500 mg | INTRAMUSCULAR | Status: DC | PRN
  Administered 2015-03-26 (×2): 0.5 mg via INTRAVENOUS

## 2015-03-26 MED ORDER — ONDANSETRON HCL 4 MG/2ML IJ SOLN
INTRAMUSCULAR | Status: DC | PRN
  Administered 2015-03-26 (×2): 4 mg via INTRAVENOUS

## 2015-03-26 MED ORDER — GLYCOPYRROLATE 0.2 MG/ML IJ SOLN
INTRAMUSCULAR | Status: DC | PRN
  Administered 2015-03-26: .4 mg via INTRAVENOUS

## 2015-03-26 MED ORDER — ONDANSETRON 4 MG PO TBDP: 4.0000 mg | ORAL_TABLET | Freq: Four times a day (QID) | ORAL | Status: DC | PRN

## 2015-03-26 SURGICAL SUPPLY — 52 items
APPLIER CLIP 11 MED OPEN (CLIP) ×3
APPLIER CLIP 9.375 MED OPEN (MISCELLANEOUS) ×3
APPLIER CLIP 9.375 SM OPEN (CLIP)
BINDER BREAST LRG (GAUZE/BANDAGES/DRESSINGS) ×3 IMPLANT
BINDER BREAST XLRG (GAUZE/BANDAGES/DRESSINGS) IMPLANT
CANISTER SUCTION 2500CC (MISCELLANEOUS) ×3 IMPLANT
CHLORAPREP W/TINT 26ML (MISCELLANEOUS) ×3 IMPLANT
CLIP APPLIE 11 MED OPEN (CLIP) ×1 IMPLANT
CLIP APPLIE 9.375 MED OPEN (MISCELLANEOUS) ×1 IMPLANT
CLIP APPLIE 9.375 SM OPEN (CLIP) IMPLANT
COVER SURGICAL LIGHT HANDLE (MISCELLANEOUS) ×3 IMPLANT
DEVICE DISSECT PLASMABLAD 3.0S (MISCELLANEOUS) ×1 IMPLANT
DRAIN CHANNEL 19F RND (DRAIN) ×3 IMPLANT
DRAPE LAPAROSCOPIC ABDOMINAL (DRAPES) ×3 IMPLANT
DRAPE UTILITY XL STRL (DRAPES) ×6 IMPLANT
DRSG PAD ABDOMINAL 8X10 ST (GAUZE/BANDAGES/DRESSINGS) ×15 IMPLANT
ELECT CAUTERY BLADE 6.4 (BLADE) ×3 IMPLANT
ELECT REM PT RETURN 9FT ADLT (ELECTROSURGICAL) ×3
ELECTRODE REM PT RTRN 9FT ADLT (ELECTROSURGICAL) ×1 IMPLANT
EVACUATOR SILICONE 100CC (DRAIN) ×3 IMPLANT
GAUZE SPONGE 4X4 12PLY STRL (GAUZE/BANDAGES/DRESSINGS) ×3 IMPLANT
GAUZE XEROFORM 5X9 LF (GAUZE/BANDAGES/DRESSINGS) IMPLANT
GLOVE BIO SURGEON STRL SZ7.5 (GLOVE) ×9 IMPLANT
GLOVE BIOGEL PI IND STRL 7.0 (GLOVE) ×1 IMPLANT
GLOVE BIOGEL PI IND STRL 7.5 (GLOVE) ×1 IMPLANT
GLOVE BIOGEL PI INDICATOR 7.0 (GLOVE) ×2
GLOVE BIOGEL PI INDICATOR 7.5 (GLOVE) ×2
GOWN STRL REUS W/ TWL LRG LVL3 (GOWN DISPOSABLE) ×3 IMPLANT
GOWN STRL REUS W/TWL LRG LVL3 (GOWN DISPOSABLE) ×6
KIT BASIN OR (CUSTOM PROCEDURE TRAY) ×3 IMPLANT
KIT ROOM TURNOVER OR (KITS) ×3 IMPLANT
LIQUID BAND (GAUZE/BANDAGES/DRESSINGS) ×6 IMPLANT
NS IRRIG 1000ML POUR BTL (IV SOLUTION) ×3 IMPLANT
PACK GENERAL/GYN (CUSTOM PROCEDURE TRAY) ×3 IMPLANT
PAD ABD 8X10 STRL (GAUZE/BANDAGES/DRESSINGS) ×3 IMPLANT
PAD ARMBOARD 7.5X6 YLW CONV (MISCELLANEOUS) ×3 IMPLANT
PLASMABLADE 3.0S (MISCELLANEOUS) ×3
SPECIMEN JAR X LARGE (MISCELLANEOUS) ×3 IMPLANT
SPONGE GAUZE 4X4 12PLY STER LF (GAUZE/BANDAGES/DRESSINGS) ×3 IMPLANT
SPONGE LAP 18X18 X RAY DECT (DISPOSABLE) ×3 IMPLANT
SUT ETHILON 3 0 FSL (SUTURE) ×3 IMPLANT
SUT MON AB 4-0 PC3 18 (SUTURE) ×3 IMPLANT
SUT VIC AB 3-0 54X BRD REEL (SUTURE) ×1 IMPLANT
SUT VIC AB 3-0 BRD 54 (SUTURE) ×2
SUT VIC AB 3-0 SH 18 (SUTURE) ×6 IMPLANT
SUT VIC AB 3-0 SH 27 (SUTURE) ×2
SUT VIC AB 3-0 SH 27XBRD (SUTURE) ×1 IMPLANT
TAPE CLOTH SURG 6X10 WHT LF (GAUZE/BANDAGES/DRESSINGS) ×3 IMPLANT
TOWEL OR 17X24 6PK STRL BLUE (TOWEL DISPOSABLE) ×3 IMPLANT
TOWEL OR 17X26 10 PK STRL BLUE (TOWEL DISPOSABLE) ×3 IMPLANT
TUBE CONNECTING 12'X1/4 (SUCTIONS) ×1
TUBE CONNECTING 12X1/4 (SUCTIONS) ×2 IMPLANT

## 2015-03-26 NOTE — H&P (Signed)
Mackenzie Clayton 02/20/2015 10:19 AM Location: [facility] Patient #: [id number] DOB: July 20, 1965 Married / Language: English / Race: White Female   History of Present Illness Mackenzie Clayton. Mackenzie Starks MD; 03/02/2015 1:07 PM) Patient words: NP left breast.  The patient is a 49 year old female who presents with a breast mass. We are asked to see the patient in consultation by Dr. Augustin Clayton to evaluate her for an intraductal papilloma of the left breast. The patient is a 49 year old white female who recently had an MRI study of both breasts performed because of her strong family history. At that time some abnormal enhancement was identified in the lower outer left breast. This was biopsied and came back as an intraductal papilloma. Her last mammogram was done in May and by report was negative. She does have a family history of breast cancer that is significant in her sister and her maternal aunt. Both of these family members were diagnosed in their 65s. The patient elected to have a hysterectomy in July of this year and is recovering well.   Other Problems Mackenzie Clayton, RMA; 02/20/2015 10:20 AM) Asthma Lump In Breast Oophorectomy  Past Surgical History Mackenzie Clayton, RMA; 02/20/2015 10:20 AM) Breast Biopsy Left. Hysterectomy (not due to cancer) - Complete Knee Surgery Right. Mammoplasty; Reduction Bilateral. Oral Surgery  Diagnostic Studies History Mackenzie Clayton, RMA; 02/20/2015 10:20 AM) Colonoscopy never Mammogram within last year Pap Smear 1-5 years ago  Allergies Mackenzie Clayton, RMA; 02/20/2015 10:22 AM) HYDROcodone Bitartrate *CHEMICALS* Ultram ER *ANALGESICS - OPIOID* Erythromycin *MACROLIDES*  Medication History (Mackenzie Clayton, RMA; 02/20/2015 10:24 AM) ProAir HFA (108 (90 Base)MCG/ACT Aerosol Soln, Inhalation) Active. Vitamin D (2000UNIT Tablet, Oral) Active. Flonase (50MCG/ACT Suspension, Nasal) Active. Singulair (10MG  Tablet, Oral) Active. Probiotic Advanced  (Oral) Active. Advil (200MG  Capsule, Oral) Active. Multi Vitamin Daily (Oral) Active. Medications Reconciled  Social History Mackenzie Clayton, RMA; 02/20/2015 10:20 AM) Alcohol use Occasional alcohol use. Caffeine use Carbonated beverages. No drug use Tobacco use Never smoker.  Family History Mackenzie Clayton, RMA; 02/20/2015 10:20 AM) Alcohol Abuse Brother. Breast Cancer Family Members In General, Sister. Cerebrovascular Accident Father, Mother. Colon Cancer Family Members In General. Depression Mother. Diabetes Mellitus Father. Heart Disease Family Members In General, Father. Hypertension Mother. Migraine Headache Mother. Respiratory Condition Family Members In General.  Pregnancy / Birth History Mackenzie Clayton, [place]; 02/20/2015 10:20 AM) Age at menarche 49 years. Contraceptive History Oral contraceptives. Gravida 5 Irregular periods Maternal age 2-25 Para 5    Review of Systems Mackenzie Clayton RMA; 02/20/2015 10:20 AM) General Not Present- Appetite Loss, Chills, Fatigue, Fever, Night Sweats, Weight Gain and Weight Loss. Skin Not Present- Change in Wart/Mole, Dryness, Hives, Jaundice, New Lesions, Non-Healing Wounds, Rash and Ulcer. HEENT Present- Ringing in the Ears, Seasonal Allergies and Wears glasses/contact lenses. Not Present- Earache, Hearing Loss, Hoarseness, Nose Bleed, Oral Ulcers, Sinus Pain, Sore Throat, Visual Disturbances and Yellow Eyes. Respiratory Present- Chronic Cough. Not Present- Bloody sputum, Difficulty Breathing, Snoring and Wheezing. Breast Present- Breast Mass. Not Present- Breast Pain, Nipple Discharge and Skin Changes. Cardiovascular Not Present- Chest Pain, Difficulty Breathing Lying Down, Leg Cramps, Palpitations, Rapid Heart Rate, Shortness of Breath and Swelling of Extremities. Gastrointestinal Not Present- Abdominal Pain, Bloating, Bloody Stool, Change in Bowel Habits, Chronic diarrhea, Constipation, Difficulty Swallowing, Excessive  gas, Gets full quickly at meals, Hemorrhoids, Indigestion, Nausea, Rectal Pain and Vomiting. Female Genitourinary Not Present- Frequency, Nocturia, Painful Urination, Pelvic Pain and Urgency. Musculoskeletal Not Present- Back Pain, Joint Pain, Joint Stiffness, Muscle Pain, Muscle  Weakness and Swelling of Extremities. Neurological Not Present- Decreased Memory, Fainting, Headaches, Numbness, Seizures, Tingling, Tremor, Trouble walking and Weakness. Psychiatric Not Present- Anxiety, Bipolar, Change in Sleep Pattern, Depression, Fearful and Frequent crying. Endocrine Present- Hot flashes. Not Present- Cold Intolerance, Excessive Hunger, Hair Changes, Heat Intolerance and New Diabetes. Hematology Not Present- Easy Bruising, Excessive bleeding, Gland problems, HIV and Persistent Infections.  Vitals (Mackenzie Clayton RMA; 02/20/2015 10:24 AM) 02/20/2015 10:24 AM Weight: 133 lb Height: 61in Body Surface Area: 1.59 m Body Mass Index: 25.13 kg/m  Temp.: 97.62F  Pulse: 80 (Regular)  BP: 120/76 (Sitting, Left Arm, Standard)       Physical Exam Mackenzie Dibbles S. Mackenzie Starks MD; 03/02/2015 1:07 PM) General Mental Status-Alert. General Appearance-Consistent with stated age. Hydration-Well hydrated. Voice-Normal.  Head and Neck Head-normocephalic, atraumatic with no lesions or palpable masses. Trachea-midline. Thyroid Gland Characteristics - normal size and consistency.  Eye Eyeball - Bilateral-Extraocular movements intact. Sclera/Conjunctiva - Bilateral-No scleral icterus.  Chest and Lung Exam Chest and lung exam reveals -quiet, even and easy respiratory effort with no use of accessory muscles and on auscultation, normal breath sounds, no adventitious sounds and normal vocal resonance. Inspection Chest Wall - Normal. Back - normal.  Breast Note: There is no palpable mass in either breast. There is no palpable axillary, supraclavicular, or cervical  lymphadenopathy.   Cardiovascular Cardiovascular examination reveals -normal heart sounds, regular rate and rhythm with no murmurs and normal pedal pulses bilaterally.  Abdomen Inspection Inspection of the abdomen reveals - No Hernias. Skin - Scar - no surgical scars. Palpation/Percussion Palpation and Percussion of the abdomen reveal - Soft, Non Tender, No Rebound tenderness, No Rigidity (guarding) and No hepatosplenomegaly. Auscultation Auscultation of the abdomen reveals - Bowel sounds normal.  Neurologic Neurologic evaluation reveals -alert and oriented x 3 with no impairment of recent or remote memory. Mental Status-Normal.  Musculoskeletal Normal Exam - Left-Upper Extremity Strength Normal and Lower Extremity Strength Normal. Normal Exam - Right-Upper Extremity Strength Normal and Lower Extremity Strength Normal.  Lymphatic Head & Neck  General Head & Neck Lymphatics: Bilateral - Description - Normal. Axillary  General Axillary Region: Bilateral - Description - Normal. Tenderness - Non Tender. Femoral & Inguinal  Generalized Femoral & Inguinal Lymphatics: Bilateral - Description - Normal. Tenderness - Non Tender.    Assessment & Plan Mackenzie Dibbles S. Mackenzie Starks MD; 02/20/2015 10:56 AM) Madelyn Flavors PAPILLOMA OF BREAST, LEFT (D24.2) Impression: The patient has a newly diagnosed intraductal papilloma of the left breast. She also has a very strong family history of breast cancer. These 2 Situations Pl. her lifetime risk of breast cancer just over 20%. Because of this and because she has very dense and nodular breast tissue that is difficult to examine she would favor bilateral mastectomies. I think this is a reasonable choice for her. I have discussed with her in detail the risks and benefits of the operation to do this as well as some of the technical aspects and she understands and wishes to proceed. She is also interested in reconstruction and so we will refer her to our plastic  surgeons to talk about the options for treatment we will then proceed in a coordinated fashion. Current Plans Referred to Surgery - Plastic, for evaluation and follow up (Plastic Surgery). Routine. Pt Education - Benign Tumors: discussed with patient and provided information.   Signed by Luella Cook, MD (03/02/2015 1:08 PM)

## 2015-03-26 NOTE — Interval H&P Note (Signed)
History and Physical Interval Note:  03/26/2015 10:59 AM  Mackenzie Clayton  has presented today for surgery, with the diagnosis of left breast intraductal papilloma  The various methods of treatment have been discussed with the patient and family. After consideration of risks, benefits and other options for treatment, the patient has consented to  Procedure(s): BILATERAL MASTECTOMY, PROPHYLATIC RIGHT  (Bilateral) as a surgical intervention .  The patient's history has been reviewed, patient examined, no change in status, stable for surgery.  I have reviewed the patient's chart and labs.  Questions were answered to the patient's satisfaction.     TOTH III,PAUL S

## 2015-03-26 NOTE — Op Note (Signed)
03/26/2015  3:14 PM  PATIENT:  Mackenzie Clayton  49 y.o. female  PRE-OPERATIVE DIAGNOSIS:  left breast intraductal papilloma  POST-OPERATIVE DIAGNOSIS:  left breast intraductal papilloma  PROCEDURE:  Procedure(s): BILATERAL MASTECTOMY, PROPHYLATIC RIGHT  (Bilateral)  SURGEON:  Surgeon(s) and Role:    * Jovita Kussmaul, MD - Primary  PHYSICIAN ASSISTANT:   ASSISTANTS: Sharyn Dross, RNFA   ANESTHESIA:   general  EBL:  Total I/O In: 1000 [I.V.:1000] Out: 100 [Blood:100]  BLOOD ADMINISTERED:none  DRAINS: (2) Jackson-Pratt drain(s) with closed bulb suction in the prepectoral space   LOCAL MEDICATIONS USED:  NONE  SPECIMEN:  Source of Specimen:  bilateral mastectomies  DISPOSITION OF SPECIMEN:  PATHOLOGY  COUNTS:  YES  TOURNIQUET:  * No tourniquets in log *  DICTATION: .Dragon Dictation   After adequate induction of general anesthesia the patient was brought to the operating room and placed in the supine position on the operating room table. After adequate induction of general anesthesia the patient's bilateral chest, breast, and axillary areas were prepped with ChloraPrep, allowed to dry, and draped in usual sterile manner. Attention was first turned to the right breast. An elliptical incision was made around the nipple and areola complex with a 15 blade knife in order to minimize the excess skin. The incision was carried through the skin and subcutaneous tissue sharply with the plasma blade. Breast hooks were used to elevate skin flaps anteriorly towards the ceiling. Thin skin flaps were created circumferentially between the breast tissue in the subcutaneous fat. This dissection was carried all the way to the chest wall circumferentially. Next the breast was removed from the pectoralis muscle with the pectoralis fascia. Once the breast was completely free it was oriented with a stitch on the lateral skin. The breast was then sent to pathology for further evaluation. Several small  vessels were controlled with clips and 2 perforating vessels medially were controlled with figure-of-eight Vicryl stitches. The wound was then irrigated with copious amounts of saline. The inferior skin flap looked a little dusky so some additional skin was removed from the inferior flap. A small stab incision was then made inferior to the operative bed along the anterior axillary line. A tonsil clamp was placed through this opening into the operative bed and used to bring a 19 [place] round Blake drain into the operative bed. The drain was anchored to the skin with a 3-0 nylon stitch. The superior and inferior skin flaps were then grossly reapproximated with interrupted 3-0 Vicryl stitches. The skin was then closed with a running 4-0 Monocryl subcuticular stitch. The skin at this point appeared healthy and viable. Attention was then turned to the left breast. A similar elliptical incision was made around the nipple and areola complex in order to minimize the excess skin. The incision was carried through the skin and subcutaneous tissue sharply with electrocautery. Breast hooks were then used to elevate the skin flaps anteriorly towards the ceiling. Thin skin flaps were created circumferentially between the breast tissue and subcutaneous fat. This dissection was carried all the way to the chest wall circumferentially. Next the breast was removed from the pectoralis muscle with the pectoralis fascia. Once the breast was free it was oriented with a stitch on the lateral skin. The breast was sent to pathology for further evaluation. The wound was irrigated with copious amount of saline. A small stab incision was made inferior to the operative area on the anterior axillary line. A tonsil clamp was placed through this  opening and used to bring a 19 [place] round Blake drain into the operative bed. The drain was anchored to the skin with a 3-0 nylon stitch. Next the superior and inferior skin flaps were grossly  reapproximated with interrupted 3-0 Vicryl stitches. The skin was then closed with a running 4-0 Monocryl subcuticular stitch. The skin flaps appeared healthy. The skin was then sealed with Dermabond. Sterile dressings and a breast binder were then applied. The drains were placed to bulb suction and there was a good seal. The patient tolerated the procedure well. At the end of the case all needle sponge and instrument counts were correct. The patient was then awakened and taken to recovery in stable condition.  PLAN OF CARE: Admit for overnight observation  PATIENT DISPOSITION:  PACU - hemodynamically stable.   Delay start of Pharmacological VTE agent (>24hrs) due to surgical blood loss or risk of bleeding: no

## 2015-03-26 NOTE — Anesthesia Preprocedure Evaluation (Signed)
Anesthesia Evaluation  Patient identified by MRN, date of birth, ID band Patient awake    Reviewed: Allergy & Precautions, NPO status , Patient's Chart, lab work & pertinent test results  History of Anesthesia Complications (+) PONV  Airway Mallampati: II  TM Distance: >3 FB Neck ROM: Full    Dental no notable dental hx.    Pulmonary neg pulmonary ROS,    Pulmonary exam normal breath sounds clear to auscultation       Cardiovascular negative cardio ROS Normal cardiovascular exam Rhythm:Regular Rate:Normal     Neuro/Psych negative neurological ROS  negative psych ROS   GI/Hepatic negative GI ROS, Neg liver ROS,   Endo/Other  negative endocrine ROS  Renal/GU negative Renal ROS  negative genitourinary   Musculoskeletal negative musculoskeletal ROS (+)   Abdominal   Peds negative pediatric ROS (+)  Hematology negative hematology ROS (+)   Anesthesia Other Findings   Reproductive/Obstetrics negative OB ROS                             Anesthesia Physical Anesthesia Plan  ASA: II  Anesthesia Plan: General   Post-op Pain Management:    Induction: Intravenous  Airway Management Planned: LMA and Oral ETT  Additional Equipment:   Intra-op Plan:   Post-operative Plan: Extubation in OR  Informed Consent: I have reviewed the patients History and Physical, chart, labs and discussed the procedure including the risks, benefits and alternatives for the proposed anesthesia with the patient or authorized representative who has indicated his/her understanding and acceptance.   Dental advisory given  Plan Discussed with: CRNA and Surgeon  Anesthesia Plan Comments:         Anesthesia Quick Evaluation

## 2015-03-26 NOTE — Anesthesia Procedure Notes (Signed)
Procedure Name: Intubation Date/Time: 03/26/2015 3:36 PM Performed by: Merdis Delay Pre-anesthesia Checklist: Patient identified, Emergency Drugs available, Suction available, Patient being monitored and Timeout performed Patient Re-evaluated:Patient Re-evaluated prior to inductionOxygen Delivery Method: Circle system utilized Preoxygenation: Pre-oxygenation with 100% oxygen Intubation Type: IV induction Ventilation: Mask ventilation without difficulty Laryngoscope Size: Mac and 3 Grade View: Grade I Tube type: Oral Tube size: 7.0 mm Number of attempts: 1 Placement Confirmation: ETT inserted through vocal cords under direct vision,  positive ETCO2,  breath sounds checked- equal and bilateral and CO2 detector Tube secured with: Tape Dental Injury: Teeth and Oropharynx as per pre-operative assessment  Comments: Performed by Gershon Crane CRNA

## 2015-03-26 NOTE — Transfer of Care (Signed)
Immediate Anesthesia Transfer of Care Note  Patient: Mackenzie Clayton  Procedure(s) Performed: Procedure(s): BILATERAL MASTECTOMY, PROPHYLATIC RIGHT  (Bilateral)  Patient Location: PACU  Anesthesia Type:General  Level of Consciousness: sedated  Airway & Oxygen Therapy: Patient Spontanous Breathing and Patient connected to nasal cannula oxygen  Post-op Assessment: Report given to RN and Post -op Vital signs reviewed and stable  Post vital signs: Reviewed and stable  Last Vitals:  Filed Vitals:   03/26/15 0955  BP: 132/89  Pulse: 103  Temp: 36.9 C  Resp: 20    Complications: No apparent anesthesia complications

## 2015-03-27 ENCOUNTER — Encounter (HOSPITAL_COMMUNITY): Payer: Self-pay | Admitting: General Surgery

## 2015-03-27 DIAGNOSIS — D242 Benign neoplasm of left breast: Secondary | ICD-10-CM | POA: Diagnosis not present

## 2015-03-27 MED ORDER — OXYCODONE-ACETAMINOPHEN 5-325 MG PO TABS: 1.0000 | ORAL_TABLET | ORAL | Status: AC | PRN

## 2015-03-27 NOTE — Progress Notes (Signed)
1 Day Post-Op  Subjective: Has been nauseated overnight and hasn't eaten yet  Objective: Vital signs in last 24 hours: Temp:  [98.2 F (36.8 C)-98.7 F (37.1 C)] 98.4 F (36.9 C) (12/06 0452) Pulse Rate:  [63-122] 63 (12/06 0452) Resp:  [11-30] 16 (12/06 0452) BP: (103-133)/(53-89) 103/58 mmHg (12/06 0452) SpO2:  [90 %-100 %] 97 % (12/06 0452) Weight:  [58.968 kg (130 lb)] 58.968 kg (130 lb) (12/05 0955)    Intake/Output from previous day: 12/05 0701 - 12/06 0700 In: 1640 [P.O.:340; I.V.:1300] Out: 530 [Urine:200; Drains:230; Blood:100] Intake/Output this shift: Total I/O In: -  Out: 90 [Drains:90]  Resp: clear to auscultation bilaterally Chest wall: skin flaps look good Cardio: regular rate and rhythm GI: soft, non-tender; bowel sounds normal; no masses,  no organomegaly  Lab Results:   Recent Labs  03/26/15 1044  WBC 6.5  HGB 13.6  HCT 42.7  PLT 196   BMET No results for input(s): NA, K, CL, CO2, GLUCOSE, BUN, CREATININE, CALCIUM in the last 72 hours. PT/INR No results for input(s): LABPROT, INR in the last 72 hours. ABG No results for input(s): PHART, HCO3 in the last 72 hours.  Invalid input(s): PCO2, PO2  Studies/Results: No results found.  Anti-infectives: Anti-infectives    Start     Dose/Rate Route Frequency Ordered Stop   03/26/15 1130  ceFAZolin (ANCEF) IVPB 2 g/50 mL premix     2 g 100 mL/hr over 30 Minutes Intravenous To [facility] 03/25/15 1510 03/26/15 1232      Assessment/Plan: s/p Procedure(s): BILATERAL MASTECTOMY, PROPHYLATIC RIGHT  (Bilateral) Advance diet. If she can not tolerate food yet then she may need another day Ambulate Teach pt drain care     TOTH III,Aqueelah Cotrell S 03/27/2015

## 2015-03-27 NOTE — Anesthesia Postprocedure Evaluation (Signed)
Anesthesia Post Note  Patient: Mackenzie Clayton  Procedure(s) Performed: Procedure(s) (LRB): BILATERAL MASTECTOMY, PROPHYLATIC RIGHT  (Bilateral)  Patient location during evaluation: PACU Anesthesia Type: General Level of consciousness: awake and alert Pain management: pain level controlled Vital Signs Assessment: post-procedure vital signs reviewed and stable Respiratory status: spontaneous breathing Cardiovascular status: blood pressure returned to baseline Postop Assessment: no headache Anesthetic complications: no    Last Vitals:  Filed Vitals:   03/27/15 0452 03/27/15 1300  BP: 103/58 110/65  Pulse: 63 99  Temp: 36.9 C 36.8 C  Resp: 16 16    Last Pain:  Filed Vitals:   03/27/15 1429  PainSc: Asleep                 Tiajuana Amass

## 2015-03-27 NOTE — Progress Notes (Signed)
Not assigned to this pt anymore since 1130hrs

## 2015-03-27 NOTE — Progress Notes (Signed)
Prn percocet x 1 pill administered for c/o pain

## 2015-03-28 ENCOUNTER — Other Ambulatory Visit: Payer: Self-pay | Admitting: General Surgery

## 2015-03-29 NOTE — Discharge Summary (Signed)
Physician Discharge Summary  Patient ID: Mackenzie Clayton MRN: [id number] DOB/AGE: 1965-05-04 49 y.o.  Admit date: 03/26/2015 Discharge date: 03/29/2015  Admission Diagnoses:  Discharge Diagnoses:  Active Problems:   Papilloma of left breast   Discharged Condition: good  Hospital Course: the pt underwent bilateral mastectomies. She had some nausea postop. On pod 1 this resolved and she was discharged home  Consults: None  Significant Diagnostic Studies: none  Treatments: surgery: as above  Discharge Exam: Blood pressure 110/65, pulse 99, temperature 98.2 F (36.8 C), temperature source Oral, resp. rate 16, height 5\' 2"  (1.575 m), weight 58.968 kg (130 lb), SpO2 98 %. Resp: clear to auscultation bilaterally Chest wall: skin flaps look good Cardio: regular rate and rhythm GI: soft, non-tender; bowel sounds normal; no masses,  no organomegaly  Disposition: 01-Home or Self Care  Discharge Instructions    Call MD for:  difficulty breathing, headache or visual disturbances    Complete by:  As directed      Call MD for:  extreme fatigue    Complete by:  As directed      Call MD for:  hives    Complete by:  As directed      Call MD for:  persistant dizziness or light-headedness    Complete by:  As directed      Call MD for:  persistant nausea and vomiting    Complete by:  As directed      Call MD for:  redness, tenderness, or signs of infection (pain, swelling, redness, odor or green/yellow discharge around incision site)    Complete by:  As directed      Call MD for:  severe uncontrolled pain    Complete by:  As directed      Call MD for:  temperature >100.4    Complete by:  As directed      Diet - low sodium heart healthy    Complete by:  As directed      Discharge instructions    Complete by:  As directed   Sponge bathe while drains are in. No overhead activity. Empty drains, record output, and recharge bulb twice a day     Increase activity slowly    Complete by:  As  directed      No wound care    Complete by:  As directed             Medication List    TAKE these medications        acetaminophen 500 MG tablet  Commonly known as:  TYLENOL  Take 1,000 mg by mouth every 6 (six) hours as needed.     aspirin-acetaminophen-caffeine 250-250-65 MG tablet  Commonly known as:  EXCEDRIN MIGRAINE  Take 1 tablet by mouth every 6 (six) hours as needed for headache or migraine.     fexofenadine 180 MG tablet  Commonly known as:  ALLEGRA  Take 180 mg by mouth daily.     ibuprofen 600 MG tablet  Commonly known as:  ADVIL,MOTRIN  Take 1 tablet (600 mg total) by mouth every 6 (six) hours as needed (mild pain).     montelukast 10 MG tablet  Commonly known as:  SINGULAIR  Take 10 mg by mouth every morning.     multivitamin with minerals Tabs tablet  Take 1 tablet by mouth daily.     oxyCODONE-acetaminophen 5-325 MG tablet  Commonly known as:  ROXICET  Take 1-2 tablets by mouth every 4 (four) hours as  needed.     PROAIR HFA 108 (90 BASE) MCG/ACT inhaler  Generic drug:  albuterol  Inhale 1-2 puffs into the lungs every 6 (six) hours as needed for wheezing or shortness of breath.     PROBIOTIC DAILY PO  Take 1 capsule by mouth daily.     Vitamin D3 2000 UNITS Tabs  Take 1 tablet by mouth daily.           Follow-up Information    Follow up with Merrie Roof, MD In 1 week.   Specialty:  General Surgery   Contact information:   [place] [address] [phone]       Signed: Merrie Roof 03/29/2015, 10:38 AM

## 2015-04-25 ENCOUNTER — Ambulatory Visit: Payer: BLUE CROSS/BLUE SHIELD | Attending: General Surgery | Admitting: Physical Therapy

## 2015-04-25 DIAGNOSIS — M25611 Stiffness of right shoulder, not elsewhere classified: Secondary | ICD-10-CM | POA: Diagnosis present

## 2015-04-25 DIAGNOSIS — M25512 Pain in left shoulder: Secondary | ICD-10-CM | POA: Diagnosis present

## 2015-04-25 DIAGNOSIS — M25511 Pain in right shoulder: Secondary | ICD-10-CM

## 2015-04-25 DIAGNOSIS — R609 Edema, unspecified: Secondary | ICD-10-CM | POA: Diagnosis present

## 2015-04-25 DIAGNOSIS — M25612 Stiffness of left shoulder, not elsewhere classified: Secondary | ICD-10-CM | POA: Diagnosis present

## 2015-04-25 NOTE — Patient Instructions (Signed)
Flexors Stick Stretch I    Stand or sit, dowel in palm of arm to be stretched. Other arm, holding dowel at side and behind body, pushes arm being stretched forward and upward until straight over head. Hold _5-10__ seconds. Repeat _5__ times per session. Do __2-3_ sessions per day.  Copyright  [employer]. All rights reserved.  Inferior Capsule Stick Stretch I    Stand or sit, dowel in palm of arm to be stretched. Other arm, holding dowel in front of body, pushes outward and upward until arm being stretched is as high to the side as possible. Hold _5-10__ seconds. Repeat __5_ times per session. Do _2-3__ sessions per day.  Copyright  VHI. All rights reserved.

## 2015-04-25 NOTE — Therapy (Signed)
[place], [place], [address] Phone: [phone]   Fax:  [phone]  Physical Therapy Evaluation  Patient Details  Name: Mackenzie Clayton MRN: [id number] Date of Birth: 05-04-1965 Referring Provider: Dr. Autumn Messing  Encounter Date: 04/25/2015      PT End of Session - 04/25/15 1038    Visit Number 1   Number of Visits 13   Date for PT Re-Evaluation 06/01/15   PT Start Time 0930   PT Stop Time 1020   PT Time Calculation (min) 50 min   Activity Tolerance Patient tolerated treatment well;Patient limited by pain   Behavior During Therapy [facility] for tasks assessed/performed      Past Medical History  Diagnosis Date  . Asthma   . History of gestational diabetes     with child # 5  . Menorrhagia   . Wears glasses   . PONV (postoperative nausea and vomiting)     Sick with patch in July 2016.  11/06/14 post op in room, pulse ox keep going off , not sure if she was on PCA or not.  . Family history of adverse reaction to anesthesia     Mother and Sister- N/V , sister did not get sick with Emend.    Past Surgical History  Procedure Laterality Date  . Knee arthroscopy Right 1988  . Reduction mammaplasty Bilateral 1997  . Wisdom tooth extraction  1995  . Cervical biopsy  w/ loop electrode excision  ~ 1999  . Laparoscopic assisted vaginal hysterectomy N/A 11/06/2014    Procedure: LAPAROSCOPIC ASSISTED VAGINAL HYSTERECTOMY;  Surgeon: Dian Queen, MD;  Location: [place];  Service: Gynecology;  Laterality: N/A;  AND VAGINA  . Salpingoophorectomy Bilateral 11/06/2014    Procedure: SALPINGO OOPHORECTOMY;  Surgeon: Dian Queen, MD;  Location: [facility];  Service: Gynecology;  Laterality: Bilateral;  AND VAGINA  . Vaginal hysterectomy    . Breast biopsy Left 01/2015  . Mastectomy Bilateral 03/26/2015    PROPHYLATIC RIGHT; "benign mass on left" (03/26/2015)  . Simple  mastectomy with axillary sentinel node biopsy Bilateral 03/26/2015    Procedure: BILATERAL MASTECTOMY, PROPHYLATIC RIGHT ;  Surgeon: Autumn Messing III, MD;  Location: [place];  Service: General;  Laterality: Bilateral;    There were no vitals filed for this visit.  Visit Diagnosis:  Stiffness of joint, shoulder region, left - Plan: PT plan of care cert/re-cert  Stiffness of joint, shoulder region, right - Plan: PT plan of care cert/re-cert  Pain in joint, shoulder region, left - Plan: PT plan of care cert/re-cert  Pain in joint, shoulder region, right - Plan: PT plan of care cert/re-cert  Postoperative edema - Plan: PT plan of care cert/re-cert      Subjective Assessment - 04/25/15 0936    Subjective Rehab the chest.  "Get me moving."  It's driving me crazy not having full range of motion.  Also reports swelling at both flanks, inferior to axillae.   Pertinent History Family history of breast cancer; personal h/o breast lumps that required monitoring. so patient chose bilateral mastectomies on 03/26/15.  Had 3 lymph nodes removed on left.  Lumps were found to be benign, as were nodes.  No reconstruction planned.   Patient Stated Goals full range of motion back   Currently in Pain? Yes   Pain Score 6   up to 8/10   Pain Location Chest   Pain Orientation Right;Left;Anterior   Pain Descriptors / Indicators  Burning   Pain Type Surgical pain   Pain Onset 1 to 4 weeks ago   Aggravating Factors  movement, doing too much   Pain Relieving Factors motrin            [facility] PT Assessment - 04/25/15 0001    Assessment   Medical Diagnosis s/p bilateral mastectomies for what was found to be benign disease with 3 lymph nodes removed on left   Referring Provider Dr. Autumn Messing   Onset Date/Surgical Date 03/26/15   Precautions   Precautions None   Restrictions   Weight Bearing Restrictions No   Other Position/Activity Restrictions 10 lb. lifting restriction; no pulling or reaching   Balance  Screen   Has the patient fallen in the past 6 months No   Has the patient had a decrease in activity level because of a fear of falling?  No   Is the patient reluctant to leave their home because of a fear of falling?  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   Leisure In the past, walked 30+ minutes 5 days a week and plays tennis 2-3 days a week.   Cognition   Overall Cognitive Status Within Functional Limits for tasks assessed   Observation/Other Assessments   Observations dog ear at lateral chest, left > right, with swelling   Skin Integrity skin healing well on both sides.   Posture/Postural Control   Posture/Postural Control Postural limitations   Postural Limitations Rounded Shoulders;Forward head   ROM / Strength   AROM / PROM / Strength AROM   AROM   AROM Assessment Site Shoulder   Right/Left Shoulder Right;Left   Right Shoulder Extension 40 Degrees   Right Shoulder Flexion 113 Degrees   Right Shoulder ABduction 98 Degrees   Right Shoulder Internal Rotation --  WFL, in supine   Right Shoulder External Rotation 65 Degrees   Left Shoulder Extension 47 Degrees   Left Shoulder Flexion 115 Degrees   Left Shoulder ABduction 88 Degrees   Left Shoulder Internal Rotation --  [facility] [place]   Left Shoulder External Rotation 60 Degrees           LYMPHEDEMA/ONCOLOGY QUESTIONNAIRE - 04/25/15 1001    Type   Cancer Type benign breast disease   Surgeries   Mastectomy Date 03/26/15  bilateral   Treatment   Active Chemotherapy Treatment No   Past Chemotherapy Treatment No   Active Radiation Treatment No   Past Radiation Treatment No   Current Hormone Treatment No   Past Hormone Therapy No   Lymphedema Assessments   Lymphedema Assessments  Upper extremities   Right Upper Extremity Lymphedema   10 cm Proximal to Olecranon Process 25.8 cm   Olecranon Process 22.7 cm   10 cm Proximal to Ulnar Styloid Process 20.5 cm   Just Proximal to Ulnar Styloid Process 14.7 cm   Across Hand at [employer] 18.3 cm   At [place] of 2nd Digit 5.7 cm   Left Upper Extremity Lymphedema   10 cm Proximal to Olecranon Process 26.8 cm   Olecranon Process 22.6 cm   10 cm Proximal to Ulnar Styloid Process 19.9 cm   Just Proximal to Ulnar Styloid Process 14.3 cm   Across Hand at [employer] 17.3 cm   At [place] of 2nd Digit 5.5 cm                [facility] Adult PT Treatment/Exercise - 04/25/15 0001    Self-Care   Self-Care Other Self-Care Comments   Other Self-Care Comments  Educated about her very low lymphedema risk on left arm, but to have blood pressure taken and labs drawn on right for safety.   Exercises   Exercises Shoulder   Shoulder Exercises: Standing   Other Standing Exercises standing dowel exercises for right and left shoulder flexion and abduction                PT Education - 04/25/15 1038    Education provided Yes   Education Details dowel exercise for shoulder flexion and abduction right and left, briefly about lymphedema risk, about ABC class on 05/07/15   Person(s) Educated Patient   Methods Explanation;Demonstration;Verbal cues;Handout   Comprehension Verbalized understanding;Returned demonstration           Short Term Clinic Goals - 04/25/15 1046    CC Short Term Goal  #1   Title Independent in HEP for bilateral shoulder ROM   Time 3   Period Weeks   Status New   CC Short Term Goal  #2   Title bilateral shoulder active flexion to at least 130 degrees for improved overhead reach   Time 3   Period Weeks   Status New   CC Short Term Goal  #3   Title bilateral shoulder active abduction to at least 120 degrees for improved ADLs   Time 3   Period Weeks   Status New             Long Term  Clinic Goals - 04/25/15 1047    CC Long Term Goal  #1   Title bilateral shoulder active flexion to at least 150 degrees for improved overhead reach   Time 6   Period Weeks   Status New   CC Long Term Goal  #2   Title bilateral shoulder active abduction to at least 150 degrees for improved ADLs   Time 6   Period Weeks   Status New   CC Long Term Goal  #3   Title independent with strength ABC program and/or knowledgeable about safe self-progression of exercise program   Time 6   Period Weeks   Status New   CC Long Term Goal  #4   Title chest pain reduced to 2/10 or less   Time 6   Period Weeks   Status New            Plan - 04/25/15 1039    Clinical Impression Statement Very pleasant and energetic young woman s/p bilateral mastectomies for what turned out to be benign breast disease.  She had 3 lymph nodes removed on the left, all negative.  She has significantly limited shoulder ROM bilaterally and some c/o swelling just inferior to axillae at both flanks.  She sees surgeon today to be checked for fluid or seroma there, as this developed recently.  Today her incisions were assessed and were healing well; axilla areas were palpated to assess swelling (difficult to measure there); both shoulders were assessed and both have limitations; arm circumferences were taken and her low lymphedema risk was considered and discussed.   Pt will benefit from skilled therapeutic intervention in order to improve on the following deficits Decreased range of motion;Impaired UE functional use;Pain;Increased edema  Rehab Potential Excellent   Clinical Impairments Affecting Rehab Potential surgery 03/26/15, so still recent and still healing at time of eval   PT Frequency 2x / week   PT Duration 6 weeks   PT Treatment/Interventions Therapeutic activities;Passive range of motion;Manual techniques;Manual lymph drainage;Patient/family education;ADLs/Self Care Home Management   PT Next Visit Plan Review dowel  exercises; begin P/AA/AROM to both shoulders; progress HEP as needed; consider manual lymph drainage for both sides (swelling at lateral aspect of incisions from mastectomies); later, consider strengthening and soft tissue mobilization as needed, once she has healed further; lymphedema risk reduction education   PT Home Exercise Plan wall walking and/or dowel ROM for both shoulders   Recommended Other Services She may attend ABC class on 05/07/15.   Consulted and Agree with Plan of Care Patient         Problem List Patient Active Problem List   Diagnosis Date Noted  . 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 Menorrhagia 11/06/2014  . Plantar fasciitis, bilateral 03/04/2013  . Collapsed arches 03/04/2013    Draeden Kellman 04/25/2015, 10:56 AM  [place], [place], [address] Phone: [phone]   Fax:  [phone]  Name: DANELLA AGRICOLA MRN: [id number] Date of Birth: December 01, 1965   Serafina Royals, PT 04/25/2015 10:56 AM

## 2015-05-02 ENCOUNTER — Ambulatory Visit: Payer: BLUE CROSS/BLUE SHIELD | Admitting: Physical Therapy

## 2015-05-02 DIAGNOSIS — M25512 Pain in left shoulder: Secondary | ICD-10-CM

## 2015-05-02 DIAGNOSIS — M25511 Pain in right shoulder: Secondary | ICD-10-CM

## 2015-05-02 DIAGNOSIS — M25611 Stiffness of right shoulder, not elsewhere classified: Secondary | ICD-10-CM

## 2015-05-02 DIAGNOSIS — M25612 Stiffness of left shoulder, not elsewhere classified: Secondary | ICD-10-CM | POA: Diagnosis not present

## 2015-05-03 NOTE — Therapy (Signed)
[place], [place], [address] Phone: [phone]   Fax:  [phone]  Physical Therapy Treatment  Patient Details  Name: Mackenzie Clayton MRN: [id number] Date of Birth: [birth year] Referring Provider: Dr. Autumn Messing  Encounter Date: 05/02/2015      PT End of Session - 05/03/15 1055    Visit Number 2   Number of Visits 13   Date for PT Re-Evaluation 06/01/15   PT Start Time 1300   PT Stop Time 1344   PT Time Calculation (min) 44 min   Activity Tolerance Patient tolerated treatment well;Patient limited by pain   Behavior During Therapy [facility] for tasks assessed/performed      Past Medical History  Diagnosis Date  . Asthma   . History of gestational diabetes     with child # 5  . Menorrhagia   . Wears glasses   . PONV (postoperative nausea and vomiting)     Sick with patch in July 2016.  11/06/14 post op in room, pulse ox keep going off , not sure if she was on PCA or not.  . Family history of adverse reaction to anesthesia     Mother and Sister- N/V , sister did not get sick with Emend.    Past Surgical History  Procedure Laterality Date  . Knee arthroscopy Right 1988  . Reduction mammaplasty Bilateral 1997  . Wisdom tooth extraction  1995  . Cervical biopsy  w/ loop electrode excision  ~ 1999  . Laparoscopic assisted vaginal hysterectomy N/A 11/06/2014    Procedure: LAPAROSCOPIC ASSISTED VAGINAL HYSTERECTOMY;  Surgeon: Dian Queen, MD;  Location: [place];  Service: Gynecology;  Laterality: N/A;  AND VAGINA  . Salpingoophorectomy Bilateral 11/06/2014    Procedure: SALPINGO OOPHORECTOMY;  Surgeon: Dian Queen, MD;  Location: [facility];  Service: Gynecology;  Laterality: Bilateral;  AND VAGINA  . Vaginal hysterectomy    . Breast biopsy Left 01/2015  . Mastectomy Bilateral 03/26/2015    PROPHYLATIC RIGHT; "benign mass on left" (03/26/2015)  . Simple  mastectomy with axillary sentinel node biopsy Bilateral 03/26/2015    Procedure: BILATERAL MASTECTOMY, PROPHYLATIC RIGHT ;  Surgeon: Autumn Messing III, MD;  Location: [place];  Service: General;  Laterality: Bilateral;    There were no vitals filed for this visit.  Visit Diagnosis:  Stiffness of joint, shoulder region, left  Stiffness of joint, shoulder region, right  Pain in joint, shoulder region, left  Pain in joint, shoulder region, right      Subjective Assessment - 05/02/15 1303    Subjective Did the exercises once or twice a day.  Trying to push herself a little bit each day.   Currently in Pain? No/denies                         [facility] Adult PT Treatment/Exercise - 05/03/15 0001    Shoulder Exercises: Supine   Other Supine Exercises supine over towel roll, bilat. shoulder horiz. abduction x 10 actively   Shoulder Exercises: Standing   Other Standing Exercises backward shoulder rolls   Other Standing Exercises wall finger ladder for abduction x 5 each side   Shoulder Exercises: Pulleys   Flexion 2 minutes   ABduction 2 minutes   Shoulder Exercises: Therapy Ball   Flexion 10 reps   Manual Therapy   Manual Therapy Passive ROM   Passive ROM In supine, for right and left shoulders with stretch  to tolerance into ER, abduction, and flexion.  Supine over towel roll, pect minor manual stretches.                   Short Term Clinic Goals - 04/25/15 1046    CC Short Term Goal  #1   Title Independent in HEP for bilateral shoulder ROM   Time 3   Period Weeks   Status New   CC Short Term Goal  #2   Title bilateral shoulder active flexion to at least 130 degrees for improved overhead reach   Time 3   Period Weeks   Status New   CC Short Term Goal  #3   Title bilateral shoulder active abduction to at least 120 degrees for improved ADLs   Time 3   Period Weeks   Status New             Long Term Clinic Goals - 04/25/15 1047    CC Long Term Goal   #1   Title bilateral shoulder active flexion to at least 150 degrees for improved overhead reach   Time 6   Period Weeks   Status New   CC Long Term Goal  #2   Title bilateral shoulder active abduction to at least 150 degrees for improved ADLs   Time 6   Period Weeks   Status New   CC Long Term Goal  #3   Title independent with strength ABC program and/or knowledgeable about safe self-progression of exercise program   Time 6   Period Weeks   Status New   CC Long Term Goal  #4   Title chest pain reduced to 2/10 or less   Time 6   Period Weeks   Status New            Plan - 05/03/15 1056    Clinical Impression Statement Treatment just started today.  Patient was able to do some exercise and tolerate stretching, but she is signficantly limited by pain at this time.  Stretching was done gently to her tolerance only.   Pt will benefit from skilled therapeutic intervention in order to improve on the following deficits Decreased range of motion;Impaired UE functional use;Pain;Increased edema   Rehab Potential Excellent   Clinical Impairments Affecting Rehab Potential surgery 03/26/15, so still recent and still healing at time of eval   PT Frequency 2x / week   PT Duration 6 weeks   PT Treatment/Interventions Therapeutic exercise;Manual techniques   PT Next Visit Plan Continue therapeutic exercise and PROM for both shoulders.   Consulted and Agree with Plan of Care Patient        Problem List Patient Active Problem List   Diagnosis Date Noted  . 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 Menorrhagia 11/06/2014  . Plantar fasciitis, bilateral 03/04/2013  . Collapsed arches 03/04/2013    [name],[name] 05/03/2015, 10:59 AM  [place], [place], [address] Phone: [phone]   Fax:  [phone]  Name: Mackenzie Clayton MRN: [id number] Date of Birth: 05-13-1965    Serafina Royals, PT 05/03/2015  10:59 AM

## 2015-05-04 ENCOUNTER — Ambulatory Visit: Payer: BLUE CROSS/BLUE SHIELD | Admitting: Physical Therapy

## 2015-05-04 DIAGNOSIS — M25511 Pain in right shoulder: Secondary | ICD-10-CM

## 2015-05-04 DIAGNOSIS — M25612 Stiffness of left shoulder, not elsewhere classified: Secondary | ICD-10-CM

## 2015-05-04 DIAGNOSIS — M25611 Stiffness of right shoulder, not elsewhere classified: Secondary | ICD-10-CM

## 2015-05-04 DIAGNOSIS — M25512 Pain in left shoulder: Secondary | ICD-10-CM

## 2015-05-04 NOTE — Therapy (Signed)
[place], [place], [address] Phone: [phone]   Fax:  [phone]  Physical Therapy Treatment  Patient Details  Name: Mackenzie Clayton MRN: [id number] Date of Birth: 1965-06-12 Referring Provider: Dr. Autumn Messing  Encounter Date: 05/04/2015      PT End of Session - 05/04/15 1210    Visit Number 3   Number of Visits 13   Date for PT Re-Evaluation 06/01/15   PT Start Time 0934   PT Stop Time 1018   PT Time Calculation (min) 44 min   Activity Tolerance Patient tolerated treatment well   Behavior During Therapy [facility] for tasks assessed/performed      Past Medical History  Diagnosis Date  . Asthma   . History of gestational diabetes     with child # 5  . Menorrhagia   . Wears glasses   . PONV (postoperative nausea and vomiting)     Sick with patch in July 2016.  11/06/14 post op in room, pulse ox keep going off , not sure if she was on PCA or not.  . Family history of adverse reaction to anesthesia     Mother and Sister- N/V , sister did not get sick with Emend.    Past Surgical History  Procedure Laterality Date  . Knee arthroscopy Right 1988  . Reduction mammaplasty Bilateral 1997  . Wisdom tooth extraction  1995  . Cervical biopsy  w/ loop electrode excision  ~ 1999  . Laparoscopic assisted vaginal hysterectomy N/A 11/06/2014    Procedure: LAPAROSCOPIC ASSISTED VAGINAL HYSTERECTOMY;  Surgeon: Dian Queen, MD;  Location: [place];  Service: Gynecology;  Laterality: N/A;  AND VAGINA  . Salpingoophorectomy Bilateral 11/06/2014    Procedure: SALPINGO OOPHORECTOMY;  Surgeon: Dian Queen, MD;  Location: [facility];  Service: Gynecology;  Laterality: Bilateral;  AND VAGINA  . Vaginal hysterectomy    . Breast biopsy Left 01/2015  . Mastectomy Bilateral 03/26/2015    PROPHYLATIC RIGHT; "benign mass on left" (03/26/2015)  . Simple mastectomy with axillary  sentinel node biopsy Bilateral 03/26/2015    Procedure: BILATERAL MASTECTOMY, PROPHYLATIC RIGHT ;  Surgeon: Autumn Messing III, MD;  Location: [place];  Service: General;  Laterality: Bilateral;    There were no vitals filed for this visit.  Visit Diagnosis:  Stiffness of joint, shoulder region, left  Stiffness of joint, shoulder region, right  Pain in joint, shoulder region, left  Pain in joint, shoulder region, right      Subjective Assessment - 05/04/15 0936    Subjective Felt sore after last visit:  "You were not my favorite person."  Took some tylenol and motrin.  Stayed sore till this morning.   Currently in Pain? Yes   Pain Score 3    Pain Location Chest   Pain Orientation Right;Left;Anterior   Pain Descriptors / Indicators Burning   Aggravating Factors  therapy session last time   Pain Relieving Factors tylenol, motrin            [facility] PT Assessment - 05/04/15 0001    AROM   Right Shoulder Flexion 133 Degrees   Right Shoulder ABduction 120 Degrees   Left Shoulder Flexion 140 Degrees   Left Shoulder ABduction 105 Degrees                     [facility] Adult PT Treatment/Exercise - 05/04/15 0001    Shoulder Exercises: Seated   Other Seated Exercises  shoulder ER with scapular retraction   Shoulder Exercises: Standing   Other Standing Exercises backward shoulder rolls   Other Standing Exercises wall finger ladder for abduction x 5 each side   Shoulder Exercises: Pulleys   Flexion 2 minutes   ABduction 2 minutes   Shoulder Exercises: Therapy Ball   Flexion 10 reps   Manual Therapy   Manual Therapy Myofascial release   Manual therapy comments     Myofascial Release right UE myofascial pulling, gentle, in supine; same on left   Passive ROM In supine, for right and left shoulders with stretch to tolerance into ER, abduction, and flexion.                    Short Term Clinic Goals - 05/04/15 1212    CC Short Term Goal  #2   Title bilateral  shoulder active flexion to at least 130 degrees for improved overhead reach   Status Achieved   CC Short Term Goal  #3   Status Partially [place] Clinic Goals - 04/25/15 1047    CC Long Term Goal  #1   Title bilateral shoulder active flexion to at least 150 degrees for improved overhead reach   Time 6   Period Weeks   Status New   CC Long Term Goal  #2   Title bilateral shoulder active abduction to at least 150 degrees for improved ADLs   Time 6   Period Weeks   Status New   CC Long Term Goal  #3   Title independent with strength ABC program and/or knowledgeable about safe self-progression of exercise program   Time 6   Period Weeks   Status New   CC Long Term Goal  #4   Title chest pain reduced to 2/10 or less   Time 6   Period Weeks   Status New            Plan - 05/04/15 1210    Clinical Impression Statement Patient reported soreness after last session, managed with tylenol.  AROM of both shoulders signficantly greater today than on initial evaluation.   Pt will benefit from skilled therapeutic intervention in order to improve on the following deficits Decreased range of motion;Impaired UE functional use;Pain;Increased edema   Rehab Potential Excellent   Clinical Impairments Affecting Rehab Potential surgery 03/26/15, so still recent and still healing at time of eval   PT Frequency 2x / week   PT Duration 6 weeks   PT Treatment/Interventions Therapeutic exercise;Manual techniques   PT Next Visit Plan Add to HEP.  Continue therapeutic exercise and PROM for both shoulders. Do lymphedema risk reduction education.   Consulted and Agree with Plan of Care Patient        Problem List Patient Active Problem List   Diagnosis Date Noted  . 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 Menorrhagia 11/06/2014  . Plantar fasciitis, bilateral 03/04/2013  . Collapsed arches 03/04/2013    Leoncio Hansen 05/04/2015, 12:13 PM  [place], [place], [address] Phone: [phone]   Fax:  [phone]  Name: Mackenzie Clayton MRN: [id number] Date of Birth: 1965-12-23    Serafina Royals, PT 05/04/2015 12:13 PM

## 2015-05-09 ENCOUNTER — Ambulatory Visit: Payer: BLUE CROSS/BLUE SHIELD | Admitting: Physical Therapy

## 2015-05-11 ENCOUNTER — Ambulatory Visit: Payer: BLUE CROSS/BLUE SHIELD | Admitting: Physical Therapy

## 2015-05-11 DIAGNOSIS — M25612 Stiffness of left shoulder, not elsewhere classified: Secondary | ICD-10-CM | POA: Diagnosis not present

## 2015-05-11 DIAGNOSIS — M25611 Stiffness of right shoulder, not elsewhere classified: Secondary | ICD-10-CM

## 2015-05-11 DIAGNOSIS — M25511 Pain in right shoulder: Secondary | ICD-10-CM

## 2015-05-11 DIAGNOSIS — M25512 Pain in left shoulder: Secondary | ICD-10-CM

## 2015-05-11 NOTE — Therapy (Signed)
[place], [place], [address] Phone: [phone]   Fax:  [phone]  Physical Therapy Treatment  Patient Details  Name: Mackenzie Clayton MRN: [id number] Date of Birth: 05/17/1965 Referring Provider: Dr. Autumn Messing  Encounter Date: 05/11/2015      PT End of Session - 05/11/15 1208    Visit Number 4   Number of Visits 13   Date for PT Re-Evaluation 06/01/15   PT Start Time 1107   PT Stop Time 1152   PT Time Calculation (min) 45 min   Activity Tolerance Patient tolerated treatment well;Patient limited by pain   Behavior During Therapy [facility] for tasks assessed/performed      Past Medical History  Diagnosis Date  . Asthma   . History of gestational diabetes     with child # 5  . Menorrhagia   . Wears glasses   . PONV (postoperative nausea and vomiting)     Sick with patch in July 2016.  11/06/14 post op in room, pulse ox keep going off , not sure if she was on PCA or not.  . Family history of adverse reaction to anesthesia     Mother and Sister- N/V , sister did not get sick with Emend.    Past Surgical History  Procedure Laterality Date  . Knee arthroscopy Right 1988  . Reduction mammaplasty Bilateral 1997  . Wisdom tooth extraction  1995  . Cervical biopsy  w/ loop electrode excision  ~ 1999  . Laparoscopic assisted vaginal hysterectomy N/A 11/06/2014    Procedure: LAPAROSCOPIC ASSISTED VAGINAL HYSTERECTOMY;  Surgeon: Dian Queen, MD;  Location: [place];  Service: Gynecology;  Laterality: N/A;  AND VAGINA  . Salpingoophorectomy Bilateral 11/06/2014    Procedure: SALPINGO OOPHORECTOMY;  Surgeon: Dian Queen, MD;  Location: [facility];  Service: Gynecology;  Laterality: Bilateral;  AND VAGINA  . Vaginal hysterectomy    . Breast biopsy Left 01/2015  . Mastectomy Bilateral 03/26/2015    PROPHYLATIC RIGHT; "benign mass on left" (03/26/2015)  . Simple  mastectomy with axillary sentinel node biopsy Bilateral 03/26/2015    Procedure: BILATERAL MASTECTOMY, PROPHYLATIC RIGHT ;  Surgeon: Autumn Messing III, MD;  Location: [place];  Service: General;  Laterality: Bilateral;    There were no vitals filed for this visit.  Visit Diagnosis:  Stiffness of joint, shoulder region, left  Stiffness of joint, shoulder region, right  Pain in joint, shoulder region, left  Pain in joint, shoulder region, right      Subjective Assessment - 05/11/15 1110    Subjective Not too bad after last visit.  Maybe some pain the next day.   Currently in Pain? No/denies                         [facility] Adult PT Treatment/Exercise - 05/11/15 0001    Shoulder Exercises: Standing   Other Standing Exercises doorway stretches 15 counts x 4; robber's pose with reaching up x 5  can't get arms to wall for robber's pose   Other Standing Exercises wall finger ladder for abduction x 5 each side; modified downward dog   Shoulder Exercises: Pulleys   Flexion 2 minutes   ABduction 2 minutes   Shoulder Exercises: Therapy Ball   Flexion 10 reps   Manual Therapy   Myofascial Release right UE myofascial pulling, gentle, in supine; same on left; O-A release and then very gentle manual cervical traction  at end of session   Passive ROM In supine, for right and left shoulders with stretch to tolerance into ER, abduction, and flexion.                 PT Education - 05/11/15 1210    Education provided Yes   Education Details doorway stretches for both shoulders   Person(s) Educated Patient   Methods Explanation;Demonstration   Comprehension Returned demonstration           [facility] Goals - 05/11/15 1210    CC Short Term Goal  #1   Title Independent in HEP for bilateral shoulder ROM   Status On-going   CC Short Term Goal  #3   Title bilateral shoulder active abduction to at least 120 degrees for improved ADLs   Status Partially [facility] Goals - 04/25/15 1047    CC Long Term Goal  #1   Title bilateral shoulder active flexion to at least 150 degrees for improved overhead reach   Time 6   Period Weeks   Status New   CC Long Term Goal  #2   Title bilateral shoulder active abduction to at least 150 degrees for improved ADLs   Time 6   Period Weeks   Status New   CC Long Term Goal  #3   Title independent with strength ABC program and/or knowledgeable about safe self-progression of exercise program   Time 6   Period Weeks   Status New   CC Long Term Goal  #4   Title chest pain reduced to 2/10 or less   Time 6   Period Weeks   Status New            Plan - 05/11/15 1208    Clinical Impression Statement Patient did better after last session.  She admitted today that she hadn't been doing her HEP because she thought that she didn't need to be doing that during her course of therapy; she was told to stretch every day.  She felt her left arm moved better today.   Pt will benefit from skilled therapeutic intervention in order to improve on the following deficits Decreased range of motion;Impaired UE functional use;Pain;Increased edema   Rehab Potential Excellent   Clinical Impairments Affecting Rehab Potential surgery 03/26/15, so still recent and still healing at time of eval   PT Duration 6 weeks   PT Treatment/Interventions Therapeutic exercise;Manual techniques;Passive range of motion   PT Next Visit Plan Add to HEP.  Continue therapeutic exercise and PROM for both shoulders. Do lymphedema risk reduction education.   Consulted and Agree with Plan of Care Patient        Problem List Patient Active Problem List   Diagnosis Date Noted  . 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 Menorrhagia 11/06/2014  . Plantar fasciitis, bilateral 03/04/2013  . Collapsed arches 03/04/2013    Mackenzie Clayton 05/11/2015, 12:12 PM  [place], [place], [address] Phone: [phone]   Fax:  [phone]  Name: Mackenzie Clayton MRN: [id number] Date of Birth: 15-Sep-1965    Serafina Royals, PT 05/11/2015 12:12 PM

## 2015-05-16 ENCOUNTER — Ambulatory Visit: Payer: BLUE CROSS/BLUE SHIELD | Admitting: Physical Therapy

## 2015-05-16 DIAGNOSIS — M25612 Stiffness of left shoulder, not elsewhere classified: Secondary | ICD-10-CM | POA: Diagnosis not present

## 2015-05-16 DIAGNOSIS — M25511 Pain in right shoulder: Secondary | ICD-10-CM

## 2015-05-16 DIAGNOSIS — M25611 Stiffness of right shoulder, not elsewhere classified: Secondary | ICD-10-CM

## 2015-05-16 DIAGNOSIS — M25512 Pain in left shoulder: Secondary | ICD-10-CM

## 2015-05-16 NOTE — Therapy (Signed)
[place], [place], [address] Phone: [phone]   Fax:  [phone]  Physical Therapy Treatment  Patient Details  Name: Mackenzie Clayton MRN: [id number] Date of Birth: [birth year] Referring Provider: Dr. Autumn Messing  Encounter Date: 05/16/2015      PT End of Session - 05/16/15 1535    Visit Number 5   Number of Visits 13   Date for PT Re-Evaluation 06/01/15   PT Start Time 1302   PT Stop Time 6144   PT Time Calculation (min) 45 min   Activity Tolerance Patient tolerated treatment well;Patient limited by pain   Behavior During Therapy [facility] for tasks assessed/performed      Past Medical History  Diagnosis Date  . Asthma   . History of gestational diabetes     with child # 5  . Menorrhagia   . Wears glasses   . PONV (postoperative nausea and vomiting)     Sick with patch in July 2016.  11/06/14 post op in room, pulse ox keep going off , not sure if she was on PCA or not.  . Family history of adverse reaction to anesthesia     Mother and Sister- N/V , sister did not get sick with Emend.    Past Surgical History  Procedure Laterality Date  . Knee arthroscopy Right 1988  . Reduction mammaplasty Bilateral 1997  . Wisdom tooth extraction  1995  . Cervical biopsy  w/ loop electrode excision  ~ 1999  . Laparoscopic assisted vaginal hysterectomy N/A 11/06/2014    Procedure: LAPAROSCOPIC ASSISTED VAGINAL HYSTERECTOMY;  Surgeon: Dian Queen, MD;  Location: [place];  Service: Gynecology;  Laterality: N/A;  AND VAGINA  . Salpingoophorectomy Bilateral 11/06/2014    Procedure: SALPINGO OOPHORECTOMY;  Surgeon: Dian Queen, MD;  Location[facility];  Service: Gynecology;  Laterality: Bilateral;  AND VAGINA  . Vaginal hysterectomy    . Breast biopsy Left 01/2015  . Mastectomy Bilateral 03/26/2015    PROPHYLATIC RIGHT; "benign mass on left" (03/26/2015)  . Simple  mastectomy with axillary sentinel node biopsy Bilateral 03/26/2015    Procedure: BILATERAL MASTECTOMY, PROPHYLATIC RIGHT ;  Surgeon: Autumn Messing III, MD;  Location: [place];  Service: General;  Laterality: Bilateral;    There were no vitals filed for this visit.  Visit Diagnosis:  Stiffness of joint, shoulder region, left  Stiffness of joint, shoulder region, right  Pain in joint, shoulder region, left  Pain in joint, shoulder region, right      Subjective Assessment - 05/16/15 1305    Subjective Was sore all the next day after last session, but "nothing I couldn't handle."  Went to yoga yesterday and will go tomorrow. Walked last night too.   Currently in Pain? Yes   Pain Score 3    Pain Location Axilla   Pain Orientation Left   Pain Descriptors / Indicators Sore   Aggravating Factors  yoga   Pain Relieving Factors tylenol or stretch            [facility] PT Assessment - 05/16/15 0001    AROM   Right Shoulder Flexion 138 Degrees   Right Shoulder ABduction 126 Degrees   Left Shoulder Flexion 141 Degrees   Left Shoulder ABduction 120 Degrees                     [facility] Adult PT Treatment/Exercise - 05/16/15 0001    Shoulder Exercises: Supine  Other Supine Exercises arms outstretched,lower trunk rotation going each way   Shoulder Exercises: Standing   Other Standing Exercises robber's pose x 8; doorway stretch 15 counts x 5   Other Standing Exercises wall finger ladder for abduction x 5 each side   Shoulder Exercises: Pulleys   Flexion 2 minutes   ABduction 2 minutes   Shoulder Exercises: Therapy Ball   Flexion 10 reps   Manual Therapy   Myofascial Release right UE myofascial pulling with little motion   Passive ROM In supine, for right and left shoulders with stretch to tolerance into ER, abduction, and flexion.                    Short Term Clinic Goals - 05/16/15 1535    CC Short Term Goal  #1   Title Independent in HEP for bilateral shoulder  ROM   Status Achieved   CC Short Term Goal  #3   Title bilateral shoulder active abduction to at least 120 degrees for improved ADLs   Status Achieved             Long Term Clinic Goals - 04/25/15 1047    CC Long Term Goal  #1   Title bilateral shoulder active flexion to at least 150 degrees for improved overhead reach   Time 6   Period Weeks   Status New   CC Long Term Goal  #2   Title bilateral shoulder active abduction to at least 150 degrees for improved ADLs   Time 6   Period Weeks   Status New   CC Long Term Goal  #3   Title independent with strength ABC program and/or knowledgeable about safe self-progression of exercise program   Time 6   Period Weeks   Status New   CC Long Term Goal  #4   Title chest pain reduced to 2/10 or less   Time 6   Period Weeks   Status New            Plan - 05/16/15 1536    Clinical Impression Statement Continuing to progress with mild improvements in shoulder AROM, except a significant improvement in left shoulder abduction, so that short term goals have now been met.  Still with pain limiting PROM and with patient reporting soreness to the day after sessions.   Pt will benefit from skilled therapeutic intervention in order to improve on the following deficits Decreased range of motion;Impaired UE functional use;Pain;Increased edema   Rehab Potential Excellent   Clinical Impairments Affecting Rehab Potential surgery 03/26/15, so still recent and still healing at time of eval   PT Frequency 2x / week   PT Duration 6 weeks   PT Treatment/Interventions Therapeutic exercise;Manual techniques;Passive range of motion   PT Next Visit Plan Add to HEP.  Continue therapeutic exercise and PROM for both shoulders. Do lymphedema risk reduction education.   PT Home Exercise Plan wall walking and/or dowel ROM for both shoulders   Consulted and Agree with Plan of Care Patient        Problem List Patient Active Problem List   Diagnosis Date  Noted  . 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 Menorrhagia 11/06/2014  . Plantar fasciitis, bilateral 03/04/2013  . Collapsed arches 03/04/2013    Mackenzie Clayton 05/16/2015, 3:38 PM  [place], [place], [address] Phone: [phone]   Fax:  [phone]  Name: Mackenzie Clayton MRN: [id number] Date of Birth:  May 24, 1965    Serafina Royals, PT 05/16/2015 3:38 PM

## 2015-05-18 ENCOUNTER — Ambulatory Visit: Payer: BLUE CROSS/BLUE SHIELD | Admitting: Physical Therapy

## 2015-05-23 ENCOUNTER — Ambulatory Visit: Payer: BLUE CROSS/BLUE SHIELD | Attending: General Surgery | Admitting: Physical Therapy

## 2015-05-23 DIAGNOSIS — M25611 Stiffness of right shoulder, not elsewhere classified: Secondary | ICD-10-CM | POA: Diagnosis present

## 2015-05-23 DIAGNOSIS — R609 Edema, unspecified: Secondary | ICD-10-CM | POA: Diagnosis present

## 2015-05-23 DIAGNOSIS — M25511 Pain in right shoulder: Secondary | ICD-10-CM | POA: Diagnosis present

## 2015-05-23 DIAGNOSIS — M25612 Stiffness of left shoulder, not elsewhere classified: Secondary | ICD-10-CM | POA: Diagnosis not present

## 2015-05-23 DIAGNOSIS — M25512 Pain in left shoulder: Secondary | ICD-10-CM | POA: Insufficient documentation

## 2015-05-23 NOTE — Therapy (Signed)
[place], [place], [address] Phone: [phone]   Fax:  [phone]  Physical Therapy Treatment  Patient Details  Name: Mackenzie Clayton MRN: [id number] Date of Birth: 1966-01-23 Referring Provider: Dr. Autumn Messing  Encounter Date: 05/23/2015      PT End of Session - 05/23/15 1723    Visit Number 6   Number of Visits 13   Date for PT Re-Evaluation 06/01/15   PT Start Time 1303   PT Stop Time [id number]   PT Time Calculation (min) 44 min   Activity Tolerance Patient tolerated treatment well;Patient limited by pain   Behavior During Therapy [facility] for tasks assessed/performed      Past Medical History  Diagnosis Date  . Asthma   . History of gestational diabetes     with child # 5  . Menorrhagia   . Wears glasses   . PONV (postoperative nausea and vomiting)     Sick with patch in July 2016.  11/06/14 post op in room, pulse ox keep going off , not sure if she was on PCA or not.  . Family history of adverse reaction to anesthesia     Mother and Sister- N/V , sister did not get sick with Emend.    Past Surgical History  Procedure Laterality Date  . Knee arthroscopy Right 1988  . Reduction mammaplasty Bilateral 1997  . Wisdom tooth extraction  1995  . Cervical biopsy  w/ loop electrode excision  ~ 1999  . Laparoscopic assisted vaginal hysterectomy N/A 11/06/2014    Procedure: LAPAROSCOPIC ASSISTED VAGINAL HYSTERECTOMY;  Surgeon: Dian Queen, MD;  Location: [place];  Service: Gynecology;  Laterality: N/A;  AND VAGINA  . Salpingoophorectomy Bilateral 11/06/2014    Procedure: SALPINGO OOPHORECTOMY;  Surgeon: Dian Queen, MD;  Location: [facility];  Service: Gynecology;  Laterality: Bilateral;  AND VAGINA  . Vaginal hysterectomy    . Breast biopsy Left 01/2015  . Mastectomy Bilateral 03/26/2015    PROPHYLATIC RIGHT; "benign mass on left" (03/26/2015)  . Simple  mastectomy with axillary sentinel node biopsy Bilateral 03/26/2015    Procedure: BILATERAL MASTECTOMY, PROPHYLATIC RIGHT ;  Surgeon: Autumn Messing III, MD;  Location: [place];  Service: General;  Laterality: Bilateral;    There were no vitals filed for this visit.  Visit Diagnosis:  Stiffness of joint, shoulder region, left  Stiffness of joint, shoulder region, right  Pain in joint, shoulder region, left  Pain in joint, shoulder region, right      Subjective Assessment - 05/23/15 1304    Subjective Nothing new except feeling better.  Has had a busy morning.  Tried a plank at yoga last week but that was too much--only lasted about three seconds.  "Life gets in the way" of doing more of the HEP at home.   Currently in Pain? No/denies  but gets jolts of nerve pains in chest unpredictably, intermittently                         [facility] Adult PT Treatment/Exercise - 05/23/15 0001    Shoulder Exercises: Sidelying   Other Sidelying Exercises reaching up and out with UE for chest and anterior shoulder stretch x 5 each side, slowly   Shoulder Exercises: Standing   Other Standing Exercises robber's pose x 5   Other Standing Exercises wall finger ladder for abduction x 5 each side   Shoulder Exercises: Pulleys  Flexion 2 minutes   ABduction 2 minutes   Shoulder Exercises: Therapy Ball   Flexion 10 reps   Manual Therapy   Myofascial Release left UE myofascial pulling with little motion   Passive ROM In supine, for right and left shoulders with stretch to tolerance into ER, abduction, and flexion.                    Short Term Clinic Goals - 05/16/15 1535    CC Short Term Goal  #1   Title Independent in HEP for bilateral shoulder ROM   Status Achieved   CC Short Term Goal  #3   Title bilateral shoulder active abduction to at least 120 degrees for improved ADLs   Status Achieved             Long Term Clinic Goals - 04/25/15 1047    CC Long Term Goal  #1    Title bilateral shoulder active flexion to at least 150 degrees for improved overhead reach   Time 6   Period Weeks   Status New   CC Long Term Goal  #2   Title bilateral shoulder active abduction to at least 150 degrees for improved ADLs   Time 6   Period Weeks   Status New   CC Long Term Goal  #3   Title independent with strength ABC program and/or knowledgeable about safe self-progression of exercise program   Time 6   Period Weeks   Status New   CC Long Term Goal  #4   Title chest pain reduced to 2/10 or less   Time 6   Period Weeks   Status New            Plan - 05/23/15 1723    Clinical Impression Statement Patient still reports soreness at end of session and is limited by pain, but is trying to push herself by going to yoga a couple of days a week opposite her therapy appointments.     Pt will benefit from skilled therapeutic intervention in order to improve on the following deficits Decreased range of motion;Impaired UE functional use;Pain;Increased edema   Rehab Potential Excellent   Clinical Impairments Affecting Rehab Potential surgery 03/26/15, so still recent and still healing at time of eval   PT Frequency 2x / week   PT Duration 6 weeks   PT Treatment/Interventions Therapeutic exercise;Manual techniques;Passive range of motion   PT Next Visit Plan Remeasure and reassess.  Continue therapeutic exercise and PROM for both shoulders. Do lymphedema risk reduction education.   PT Home Exercise Plan wall walking and/or dowel ROM for both shoulders, yoga, sidelying and bring arm up and out in D2 pattern (each side)   Consulted and Agree with Plan of Care Patient        Problem List Patient Active Problem List   Diagnosis Date Noted  . 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 Menorrhagia 11/06/2014  . Plantar fasciitis, bilateral 03/04/2013  . Collapsed arches 03/04/2013    Mackenzie,Clayton 05/23/2015, 5:26 PM  [place], [place], [address] Phone: [phone]   Fax:  [phone]  Name: Mackenzie Clayton MRN: [id number] Date of Birth: 1966/01/02    Mackenzie Clayton, PT 05/23/2015 5:26 PM

## 2015-05-25 ENCOUNTER — Ambulatory Visit: Payer: BLUE CROSS/BLUE SHIELD | Admitting: Physical Therapy

## 2015-05-25 DIAGNOSIS — M25612 Stiffness of left shoulder, not elsewhere classified: Secondary | ICD-10-CM | POA: Diagnosis not present

## 2015-05-25 DIAGNOSIS — M25511 Pain in right shoulder: Secondary | ICD-10-CM

## 2015-05-25 DIAGNOSIS — M25512 Pain in left shoulder: Secondary | ICD-10-CM

## 2015-05-25 DIAGNOSIS — M25611 Stiffness of right shoulder, not elsewhere classified: Secondary | ICD-10-CM

## 2015-05-25 NOTE — Therapy (Signed)
[facility] [place], [place], [address] Phone: [phone]   Fax:  [phone]  Physical Therapy Treatment  Patient Details  Name: Mackenzie Clayton MRN: [id number] Date of Birth: 30-Apr-1965 Referring Provider: Dr. Autumn Messing  Encounter Date: 05/25/2015      PT End of Session - 05/25/15 1235    Visit Number 7   Number of Visits 13   Date for PT Re-Evaluation 06/01/15   PT Start Time 0932   PT Stop Time 1015   PT Time Calculation (min) 43 min   Activity Tolerance Patient tolerated treatment well   Behavior During Therapy [facility] for tasks assessed/performed      Past Medical History  Diagnosis Date  . Asthma   . History of gestational diabetes     with child # 5  . Menorrhagia   . Wears glasses   . PONV (postoperative nausea and vomiting)     Sick with patch in July 2016.  11/06/14 post op in room, pulse ox keep going off , not sure if she was on PCA or not.  . Family history of adverse reaction to anesthesia     Mother and Sister- N/V , sister did not get sick with Emend.    Past Surgical History  Procedure Laterality Date  . Knee arthroscopy Right 1988  . Reduction mammaplasty Bilateral 1997  . Wisdom tooth extraction  1995  . Cervical biopsy  w/ loop electrode excision  ~ 1999  . Laparoscopic assisted vaginal hysterectomy N/A 11/06/2014    Procedure: LAPAROSCOPIC ASSISTED VAGINAL HYSTERECTOMY;  Surgeon: Dian Queen, MD;  Location: [place];  Service: Gynecology;  Laterality: N/A;  AND VAGINA  . Salpingoophorectomy Bilateral 11/06/2014    Procedure: SALPINGO OOPHORECTOMY;  Surgeon: Dian Queen, MD;  Location: [facility];  Service: Gynecology;  Laterality: Bilateral;  AND VAGINA  . Vaginal hysterectomy    . Breast biopsy Left 01/2015  . Mastectomy Bilateral 03/26/2015    PROPHYLATIC RIGHT; "benign mass on left" (03/26/2015)  . Simple mastectomy with axillary sentinel  node biopsy Bilateral 03/26/2015    Procedure: BILATERAL MASTECTOMY, PROPHYLATIC RIGHT ;  Surgeon: Autumn Messing III, MD;  Location: [place];  Service: General;  Laterality: Bilateral;    There were no vitals filed for this visit.  Visit Diagnosis:  Stiffness of joint, shoulder region, left  Stiffness of joint, shoulder region, right  Pain in joint, shoulder region, left  Pain in joint, shoulder region, right      Subjective Assessment - 05/25/15 0931    Subjective It's always the next morning when I wake up, I can feel it.  My right shoulder kind of hurt but I don't think that's from you--it's that problem I have with that side.     Currently in Pain? Yes   Pain Score 1    Pain Location Back   Pain Orientation Right;Left;Mid   Pain Descriptors / Indicators Other (Comment)  "nothing earth shattering; you notice it"   Aggravating Factors  putting arm overhead   Pain Relieving Factors pain meds            [facility] PT Assessment - 05/25/15 0001    AROM   Right Shoulder Flexion 141 Degrees   Right Shoulder ABduction 172 Degrees   Left Shoulder Flexion 154 Degrees   Left Shoulder ABduction 149 Degrees  [facility] Adult PT Treatment/Exercise - 05/25/15 0001    Shoulder Exercises: Standing   Other Standing Exercises wall finger ladder for abduction x 5 each side  1 lb. weight on wrist for this   Shoulder Exercises: Pulleys   Flexion 2 minutes   ABduction 2 minutes   Shoulder Exercises: Therapy Ball   Flexion 10 reps  with 1 lb. weight on each wrist   Manual Therapy   Manual Therapy Soft tissue mobilization   Soft tissue mobilization soft tissue mobilization at scars on both sides of chest, gently   Myofascial Release crosshands technique at bilateral chest in vertical, horizontal, and diagonal directions at chest   Passive ROM pect minor stretches                   Short Term Clinic Goals - 05/16/15 1535    CC Short Term Goal  #1    Title Independent in HEP for bilateral shoulder ROM   Status Achieved   CC Short Term Goal  #3   Title bilateral shoulder active abduction to at least 120 degrees for improved ADLs   Status Achieved             Long Term Clinic Goals - 05/25/15 1237    CC Long Term Goal  #1   Title bilateral shoulder active flexion to at least 150 degrees for improved overhead reach   Status Partially Met   CC Long Term Goal  #2   Title bilateral shoulder active abduction to at least 150 degrees for improved ADLs   Status Partially Met   CC Long Term Goal  #3   Title independent with strength ABC program and/or knowledgeable about safe self-progression of exercise program   Status On-going   CC Long Term Goal  #4   Title chest pain reduced to 2/10 or less   Status On-going            Plan - 05/25/15 1235    Clinical Impression Statement Patient with very nice gains in ROM in both shoulders this week.  Nice progress overall.   Pt will benefit from skilled therapeutic intervention in order to improve on the following deficits Decreased range of motion;Impaired UE functional use;Pain;Increased edema   Rehab Potential Excellent   Clinical Impairments Affecting Rehab Potential surgery 03/26/15, so still recent and still healing at time of eval   PT Frequency 2x / week   PT Duration 6 weeks   PT Treatment/Interventions Therapeutic exercise;Manual techniques;Passive range of motion   PT Next Visit Plan ROM and manual techniques including soft tissue mobilization and myofascial release.  Try manual lymph drainage for both breast areas.   PT Home Exercise Plan wall walking and/or dowel ROM for both shoulders, yoga, sidelying and bring arm up and out in D2 pattern (each side)   Consulted and Agree with Plan of Care Patient        Problem List Patient Active Problem List   Diagnosis Date Noted  . 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 Menorrhagia 11/06/2014  . Plantar fasciitis, bilateral  03/04/2013  . Collapsed arches 03/04/2013    [name],[name] 05/25/2015, 12:39 PM  [place], [place], [address] Phone: [phone]   Fax:  [phone]  Name: Mackenzie Clayton MRN: [id number] Date of Birth: 1966-04-15    Serafina Royals, PT 05/25/2015 12:39 PM

## 2015-05-29 ENCOUNTER — Ambulatory Visit: Payer: BLUE CROSS/BLUE SHIELD | Admitting: Physical Therapy

## 2015-05-29 DIAGNOSIS — M25512 Pain in left shoulder: Secondary | ICD-10-CM

## 2015-05-29 DIAGNOSIS — M25612 Stiffness of left shoulder, not elsewhere classified: Secondary | ICD-10-CM

## 2015-05-29 DIAGNOSIS — R609 Edema, unspecified: Secondary | ICD-10-CM

## 2015-05-29 DIAGNOSIS — M25511 Pain in right shoulder: Secondary | ICD-10-CM

## 2015-05-29 DIAGNOSIS — M25611 Stiffness of right shoulder, not elsewhere classified: Secondary | ICD-10-CM

## 2015-05-29 NOTE — Therapy (Signed)
[place], [place], [address] Phone: [phone]   Fax:  [phone]  Physical Therapy Treatment  Patient Details  Name: Mackenzie Clayton MRN: [id number] Date of Birth: 01/19/1966 Referring Provider: Dr. Autumn Messing  Encounter Date: 05/29/2015      PT End of Session - 05/29/15 1727    Visit Number 8   Number of Visits 13   Date for PT Re-Evaluation 06/01/15   PT Start Time 1525   PT Stop Time 1608   PT Time Calculation (min) 43 min   Activity Tolerance Patient tolerated treatment well   Behavior During Therapy [facility] for tasks assessed/performed      Past Medical History  Diagnosis Date  . Asthma   . History of gestational diabetes     with child # 5  . Menorrhagia   . Wears glasses   . PONV (postoperative nausea and vomiting)     Sick with patch in July 2016.  11/06/14 post op in room, pulse ox keep going off , not sure if she was on PCA or not.  . Family history of adverse reaction to anesthesia     Mother and Sister- N/V , sister did not get sick with Emend.    Past Surgical History  Procedure Laterality Date  . Knee arthroscopy Right 1988  . Reduction mammaplasty Bilateral 1997  . Wisdom tooth extraction  1995  . Cervical biopsy  w/ loop electrode excision  ~ 1999  . Laparoscopic assisted vaginal hysterectomy N/A 11/06/2014    Procedure: LAPAROSCOPIC ASSISTED VAGINAL HYSTERECTOMY;  Surgeon: Dian Queen, MD;  Location: [place];  Service: Gynecology;  Laterality: N/A;  AND VAGINA  . Salpingoophorectomy Bilateral 11/06/2014    Procedure: SALPINGO OOPHORECTOMY;  Surgeon: Dian Queen, MD;  Location: [facility];  Service: Gynecology;  Laterality: Bilateral;  AND VAGINA  . Vaginal hysterectomy    . Breast biopsy Left 01/2015  . Mastectomy Bilateral 03/26/2015    PROPHYLATIC RIGHT; "benign mass on left" (03/26/2015)  . Simple mastectomy with axillary sentinel  node biopsy Bilateral 03/26/2015    Procedure: BILATERAL MASTECTOMY, PROPHYLATIC RIGHT ;  Surgeon: Autumn Messing III, MD;  Location: [place];  Service: General;  Laterality: Bilateral;    There were no vitals filed for this visit.  Visit Diagnosis:  Stiffness of joint, shoulder region, left  Stiffness of joint, shoulder region, right  Pain in joint, shoulder region, left  Pain in joint, shoulder region, right  Postoperative edema      Subjective Assessment - 05/29/15 1527    Subjective It felt like a bunch of nerve endings since the last session.  Friday to Saturday was pretty rough.   Currently in Pain? No/denies                         [facility] Adult PT Treatment/Exercise - 05/29/15 0001    Shoulder Exercises: Standing   Other Standing Exercises wall finger ladder for abduction x 5 each side  1 lb. weight on wrist for this   Shoulder Exercises: Pulleys   Flexion 2 minutes   ABduction 2 minutes   Shoulder Exercises: Therapy Ball   Flexion 10 reps  with 1 lb. weight on each wrist   Manual Therapy   Manual Therapy Manual Lymphatic Drainage (MLD)   Soft tissue mobilization soft tissue mobilization at scars on both sides of chest, gently   Myofascial Release crosshands technique at  bilateral chest in vertical, horizontal, and diagonal directions at chest   Manual Lymphatic Drainage (MLD) short neck, superficial abdomen, diaphragmatic breathing, right groin and axillo-inguinal anastomosis, and same on left side, with extra time spent at indurated areas around mastectomy scars on both sides.   Passive ROM pect minor stretches                   Short Term Clinic Goals - 05/16/15 1535    CC Short Term Goal  #1   Title Independent in HEP for bilateral shoulder ROM   Status Achieved   CC Short Term Goal  #3   Title bilateral shoulder active abduction to at least 120 degrees for improved ADLs   Status Achieved             Long Term Clinic Goals -  05/25/15 1237    CC Long Term Goal  #1   Title bilateral shoulder active flexion to at least 150 degrees for improved overhead reach   Status Partially Met   CC Long Term Goal  #2   Title bilateral shoulder active abduction to at least 150 degrees for improved ADLs   Status Partially Met   CC Long Term Goal  #3   Title independent with strength ABC program and/or knowledgeable about safe self-progression of exercise program   Status On-going   CC Long Term Goal  #4   Title chest pain reduced to 2/10 or less   Status On-going            Plan - 05/29/15 1727    Clinical Impression Statement Continuing to do well.  She went to yoga earlier in the day and did some stretching there; tried  manual lymph drainage for the first time today.  She was also wearing a mildly compressive sports bra today that she said is comfortable for her and may help swelling a bit.   Pt will benefit from skilled therapeutic intervention in order to improve on the following deficits Decreased range of motion;Impaired UE functional use;Pain;Increased edema   Rehab Potential Excellent   Clinical Impairments Affecting Rehab Potential surgery 03/26/15, so still recent and still healing at time of eval   PT Frequency 2x / week   PT Duration 6 weeks   PT Treatment/Interventions Therapeutic exercise;Manual techniques;Manual lymph drainage   PT Next Visit Plan Remeasure ROM.  ROM and manual techniques including soft tissue mobilization and myofascial release.  Try manual lymph drainage for both breast areas.   PT Home Exercise Plan wall walking and/or dowel ROM for both shoulders, yoga, sidelying and bring arm up and out in D2 pattern (each side)   Consulted and Agree with Plan of Care Patient        Problem List Patient Active Problem List   Diagnosis Date Noted  . 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 Menorrhagia 11/06/2014  . Plantar fasciitis, bilateral 03/04/2013  . Collapsed arches 03/04/2013     Mackenzie Clayton 05/29/2015, 5:31 PM  [place], [place], [address] Phone: [phone]   Fax:  [phone]  Name: Mackenzie Clayton MRN: [id number] Date of Birth: 09-Jan-1966    Serafina Royals, PT 05/29/2015 5:31 PM

## 2015-06-01 ENCOUNTER — Ambulatory Visit: Payer: BLUE CROSS/BLUE SHIELD | Admitting: Physical Therapy

## 2015-06-01 DIAGNOSIS — M25612 Stiffness of left shoulder, not elsewhere classified: Secondary | ICD-10-CM

## 2015-06-01 DIAGNOSIS — R609 Edema, unspecified: Secondary | ICD-10-CM

## 2015-06-01 DIAGNOSIS — M25511 Pain in right shoulder: Secondary | ICD-10-CM

## 2015-06-01 DIAGNOSIS — M25611 Stiffness of right shoulder, not elsewhere classified: Secondary | ICD-10-CM

## 2015-06-01 DIAGNOSIS — M25512 Pain in left shoulder: Secondary | ICD-10-CM

## 2015-06-01 NOTE — Therapy (Signed)
[place], [place], [address] Phone: [phone]   Fax:  [phone]  Physical Therapy Treatment  Patient Details  Name: Mackenzie Clayton MRN: [id number] Date of Birth: 1966-02-06 Referring Provider: Dr. Autumn Messing  Encounter Date: 06/01/2015      PT End of Session - 06/01/15 1213    Visit Number 9   Number of Visits 13   Date for PT Re-Evaluation 06/01/15   PT Start Time 0935   PT Stop Time 1020   PT Time Calculation (min) 45 min   Activity Tolerance Patient tolerated treatment well   Behavior During Therapy [facility] for tasks assessed/performed      Past Medical History  Diagnosis Date  . Asthma   . History of gestational diabetes     with child # 5  . Menorrhagia   . Wears glasses   . PONV (postoperative nausea and vomiting)     Sick with patch in July 2016.  11/06/14 post op in room, pulse ox keep going off , not sure if she was on PCA or not.  . Family history of adverse reaction to anesthesia     Mother and Sister- N/V , sister did not get sick with Emend.    Past Surgical History  Procedure Laterality Date  . Knee arthroscopy Right 1988  . Reduction mammaplasty Bilateral 1997  . Wisdom tooth extraction  1995  . Cervical biopsy  w/ loop electrode excision  ~ 1999  . Laparoscopic assisted vaginal hysterectomy N/A 11/06/2014    Procedure: LAPAROSCOPIC ASSISTED VAGINAL HYSTERECTOMY;  Surgeon: Dian Queen, MD;  Location: [place];  Service: Gynecology;  Laterality: N/A;  AND VAGINA  . Salpingoophorectomy Bilateral 11/06/2014    Procedure: SALPINGO OOPHORECTOMY;  Surgeon: Dian Queen, MD;  Location: [facility];  Service: Gynecology;  Laterality: Bilateral;  AND VAGINA  . Vaginal hysterectomy    . Breast biopsy Left 01/2015  . Mastectomy Bilateral 03/26/2015    PROPHYLATIC RIGHT; "benign mass on left" (03/26/2015)  . Simple mastectomy with axillary  sentinel node biopsy Bilateral 03/26/2015    Procedure: BILATERAL MASTECTOMY, PROPHYLATIC RIGHT ;  Surgeon: Autumn Messing III, MD;  Location: [place];  Service: General;  Laterality: Bilateral;    There were no vitals filed for this visit.  Visit Diagnosis:  Stiffness of joint, shoulder region, left  Stiffness of joint, shoulder region, right  Pain in joint, shoulder region, left  Pain in joint, shoulder region, right  Postoperative edema      Subjective Assessment - 06/01/15 0937    Subjective It really made a difference last time--I don't know if it was what you did last or what.  It was better.   Currently in Pain? No/denies            [facility] PT Assessment - 06/01/15 0001    AROM   Right Shoulder Flexion 145 Degrees   Right Shoulder ABduction 180 Degrees   Left Shoulder Flexion 153 Degrees   Left Shoulder ABduction 117 Degrees                     [facility] Adult PT Treatment/Exercise - 06/01/15 0001    Shoulder Exercises: Standing   Other Standing Exercises wall finger ladder for abduction x 5 each side  2 lb. weight on wrist for this   Shoulder Exercises: Pulleys   Flexion 2 minutes   ABduction 2 minutes   Shoulder Exercises: Therapy Diona Foley  Flexion 10 reps  with 2 lb. weight on each wrist   Manual Therapy   Soft tissue mobilization soft tissue mobilization at scars on both sides of chest, gently   Manual Lymphatic Drainage (MLD) short neck, superficial abdomen, diaphragmatic breathing, right groin and axillo-inguinal anastomosis, and same on left side, with extra time spent at indurated areas around mastectomy scars on both sides.   Passive ROM pect minor stretches; left shoulder passive abduction to tolerance                   Short Term Clinic Goals - 05/16/15 1535    CC Short Term Goal  #1   Title Independent in HEP for bilateral shoulder ROM   Status Achieved   CC Short Term Goal  #3   Title bilateral shoulder active abduction to at least  120 degrees for improved ADLs   Status Achieved             Long Term Clinic Goals - 06/01/15 1215    CC Long Term Goal  #1   Title bilateral shoulder active flexion to at least 150 degrees for improved overhead reach   Status Partially Met   CC Long Term Goal  #2   Title bilateral shoulder active abduction to at least 150 degrees for improved ADLs   Status Partially Met   CC Long Term Goal  #3   Title independent with strength ABC program and/or knowledgeable about safe self-progression of exercise program   Status On-going   CC Long Term Goal  #4   Title chest pain reduced to 2/10 or less   Status On-going            Plan - 06/01/15 1213    Clinical Impression Statement Patient admits to not having had time for exercise since last session, though she does try to reach and stretch with household chores.  She improved right shoulder AROM by a few degrees, but not left this week.  She did feel much better after first trial of manual lymph drainage.   Pt will benefit from skilled therapeutic intervention in order to improve on the following deficits Decreased range of motion;Impaired UE functional use;Pain;Increased edema   Rehab Potential Excellent   Clinical Impairments Affecting Rehab Potential surgery 03/26/15, so still recent and still healing at time of eval   PT Frequency 2x / week   PT Duration 6 weeks   PT Treatment/Interventions Therapeutic exercise;Manual techniques;Manual lymph drainage   PT Next Visit Plan ROM and manual techniques including soft tissue mobilization and myofascial release; manual lymph drainage for both breast areas.   Consulted and Agree with Plan of Care Patient        Problem List Patient Active Problem List   Diagnosis Date Noted  . 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 Menorrhagia 11/06/2014  . Plantar fasciitis, bilateral 03/04/2013  . Collapsed arches 03/04/2013    [name],[name] 06/01/2015, 12:17 PM  [place], [place], [address] Phone: [phone]   Fax:  [phone]  Name: Mackenzie Clayton MRN: [id number] Date of Birth: June 24, 1965

## 2015-06-04 ENCOUNTER — Ambulatory Visit: Payer: BLUE CROSS/BLUE SHIELD | Admitting: Physical Therapy

## 2015-06-08 ENCOUNTER — Ambulatory Visit: Payer: BLUE CROSS/BLUE SHIELD | Admitting: Physical Therapy

## 2015-06-11 ENCOUNTER — Ambulatory Visit: Payer: BLUE CROSS/BLUE SHIELD | Admitting: Physical Therapy

## 2015-06-11 ENCOUNTER — Encounter: Payer: Self-pay | Admitting: Physical Therapy

## 2015-06-11 DIAGNOSIS — M25511 Pain in right shoulder: Secondary | ICD-10-CM

## 2015-06-11 DIAGNOSIS — M25512 Pain in left shoulder: Secondary | ICD-10-CM

## 2015-06-11 DIAGNOSIS — M25611 Stiffness of right shoulder, not elsewhere classified: Secondary | ICD-10-CM

## 2015-06-11 DIAGNOSIS — R609 Edema, unspecified: Secondary | ICD-10-CM

## 2015-06-11 DIAGNOSIS — M25612 Stiffness of left shoulder, not elsewhere classified: Secondary | ICD-10-CM

## 2015-06-11 NOTE — Therapy (Signed)
[place], [place], [address] Phone: [phone]   Fax:  [phone]  Physical Therapy Treatment  Patient Details  Name: Mackenzie Clayton MRN: [id number] Date of Birth: 1965/09/14 Referring Provider: Dr. Autumn Messing  Encounter Date: 06/11/2015      PT End of Session - 06/11/15 2156    Visit Number 10   Number of Visits 18   Date for PT Re-Evaluation 07/09/15   PT Start Time 7209   PT Stop Time 1433   PT Time Calculation (min) 41 min   Activity Tolerance Patient tolerated treatment well   Behavior During Therapy [facility] for tasks assessed/performed      Past Medical History  Diagnosis Date  . Asthma   . History of gestational diabetes     with child # 5  . Menorrhagia   . Wears glasses   . PONV (postoperative nausea and vomiting)     Sick with patch in July 2016.  11/06/14 post op in room, pulse ox keep going off , not sure if she was on PCA or not.  . Family history of adverse reaction to anesthesia     Mother and Sister- N/V , sister did not get sick with Emend.    Past Surgical History  Procedure Laterality Date  . Knee arthroscopy Right 1988  . Reduction mammaplasty Bilateral 1997  . Wisdom tooth extraction  1995  . Cervical biopsy  w/ loop electrode excision  ~ 1999  . Laparoscopic assisted vaginal hysterectomy N/A 11/06/2014    Procedure: LAPAROSCOPIC ASSISTED VAGINAL HYSTERECTOMY;  Surgeon: Dian Queen, MD;  Location: [place];  Service: Gynecology;  Laterality: N/A;  AND VAGINA  . Salpingoophorectomy Bilateral 11/06/2014    Procedure: SALPINGO OOPHORECTOMY;  Surgeon: Dian Queen, MD;  Location: [facility];  Service: Gynecology;  Laterality: Bilateral;  AND VAGINA  . Vaginal hysterectomy    . Breast biopsy Left 01/2015  . Mastectomy Bilateral 03/26/2015    PROPHYLATIC RIGHT; "benign mass on left" (03/26/2015)  . Simple mastectomy with axillary  sentinel node biopsy Bilateral 03/26/2015    Procedure: BILATERAL MASTECTOMY, PROPHYLATIC RIGHT ;  Surgeon: Autumn Messing III, MD;  Location: [place];  Service: General;  Laterality: Bilateral;    There were no vitals filed for this visit.  Visit Diagnosis:  Stiffness of joint, shoulder region, left - Plan: PT plan of care cert/re-cert  Stiffness of joint, shoulder region, right - Plan: PT plan of care cert/re-cert  Pain in joint, shoulder region, left - Plan: PT plan of care cert/re-cert  Pain in joint, shoulder region, right - Plan: PT plan of care cert/re-cert  Postoperative edema - Plan: PT plan of care cert/re-cert      Subjective Assessment - 06/11/15 1355    Subjective I was sick last week. I had a respiratory illness. I do part of my home exercise program. I was unable to go to yoga because I was sick.   Pertinent History Family history of breast cancer; personal h/o breast lumps that required monitoring. so patient chose bilateral mastectomies on 03/26/15.  Had 3 lymph nodes removed on left.  Lumps were found to be benign, as were nodes.  No reconstruction planned.   Patient Stated Goals full range of motion back   Currently in Pain? No/denies   Pain Onset 1 to 4 weeks ago  [facility] Adult PT Treatment/Exercise - 06/11/15 0001    Shoulder Exercises: Standing   Other Standing Exercises wall finger ladder for abduction x 5 each side  2 lb. weight on wrist for this   Shoulder Exercises: Pulleys   Flexion 2 minutes   ABduction 2 minutes   Shoulder Exercises: Therapy Ball   Flexion 10 reps  with 2 lb. weight on each wrist   Manual Therapy   Soft tissue mobilization soft tissue mobilization at scars on both sides of chest, gently   Myofascial Release crosshands technique at bilateral chest in vertical   Manual Lymphatic Drainage (MLD) short neck, superficial abdomen, diaphragmatic breathing, right groin and axillo-inguinal anastomosis, and same on  left side, with extra time spent at indurated areas around mastectomy scars on both sides.   Passive ROM pect minor stretches;                   Short Term Clinic Goals - 06/11/15 2200    CC Short Term Goal  #1   Title Independent in HEP for bilateral shoulder ROM   Status Achieved   CC Short Term Goal  #2   Title bilateral shoulder active flexion to at least 130 degrees for improved overhead reach   Status Achieved   CC Short Term Goal  #3   Title bilateral shoulder active abduction to at least 120 degrees for improved ADLs   Status Achieved             Long Term Clinic Goals - 06/11/15 2201    CC Long Term Goal  #1   Title bilateral shoulder active flexion to at least 150 degrees for improved overhead reach   Status Partially Met   CC Long Term Goal  #2   Title bilateral shoulder active abduction to at least 150 degrees for improved ADLs   Status Partially Met   CC Long Term Goal  #3   Title independent with strength ABC program and/or knowledgeable about safe self-progression of exercise program   Status On-going   CC Long Term Goal  #4   Title chest pain reduced to 2/10 or less   Status On-going            Plan - 06/11/15 2157    Clinical Impression Statement Patient was sick last week but has recovered and did well with session today.  Her AAROM of shoulders appears improved.  Manual lymph drainage benefits her, per her report.     Pt will benefit from skilled therapeutic intervention in order to improve on the following deficits Decreased range of motion;Impaired UE functional use;Pain;Increased edema   Rehab Potential Excellent   Clinical Impairments Affecting Rehab Potential surgery 03/26/15, so still recent and still healing at time of eval   PT Frequency 2x / week   PT Duration 4 weeks   PT Treatment/Interventions Therapeutic exercise;Manual techniques;Manual lymph drainage   PT Next Visit Plan Remeasure.  ROM and manual techniques including soft  tissue mobilization and myofascial release; manual lymph drainage for both breast areas.   Consulted and Agree with Plan of Care Patient        Problem List Patient Active Problem List   Diagnosis Date Noted  . 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 Menorrhagia 11/06/2014  . Plantar fasciitis, bilateral 03/04/2013  . Collapsed arches 03/04/2013    Murtaza Shell 06/11/2015, 10:04 PM  [place], [place], [address] Phone: [phone]   Fax:  [phone]  Name: ANNASTYN SILVEY MRN: [id number] Date of Birth: [birth year]    Serafina Royals, PT 06/11/2015 10:04 PM

## 2015-06-15 ENCOUNTER — Ambulatory Visit: Payer: BLUE CROSS/BLUE SHIELD | Admitting: Physical Therapy

## 2015-06-15 DIAGNOSIS — M25612 Stiffness of left shoulder, not elsewhere classified: Secondary | ICD-10-CM

## 2015-06-15 DIAGNOSIS — M25511 Pain in right shoulder: Secondary | ICD-10-CM

## 2015-06-15 DIAGNOSIS — M25512 Pain in left shoulder: Secondary | ICD-10-CM

## 2015-06-15 DIAGNOSIS — M25611 Stiffness of right shoulder, not elsewhere classified: Secondary | ICD-10-CM

## 2015-06-15 NOTE — Therapy (Signed)
[place], [place], [address] Phone: [phone]   Fax:  [phone]  Physical Therapy Treatment  Patient Details  Name: Mackenzie Clayton MRN: [id number] Date of Birth: 15-Jun-1965 Referring Provider: Dr. Autumn Messing  Encounter Date: 06/15/2015      PT End of Session - 06/15/15 1211    Visit Number 11   Number of Visits 18   Date for PT Re-Evaluation 07/09/15   PT Start Time 1107   PT Stop Time 1155   PT Time Calculation (min) 48 min   Activity Tolerance Patient tolerated treatment well   Behavior During Therapy [facility] for tasks assessed/performed      Past Medical History  Diagnosis Date  . Asthma   . History of gestational diabetes     with child # 5  . Menorrhagia   . Wears glasses   . PONV (postoperative nausea and vomiting)     Sick with patch in July 2016.  11/06/14 post op in room, pulse ox keep going off , not sure if she was on PCA or not.  . Family history of adverse reaction to anesthesia     Mother and Sister- N/V , sister did not get sick with Emend.    Past Surgical History  Procedure Laterality Date  . Knee arthroscopy Right 1988  . Reduction mammaplasty Bilateral 1997  . Wisdom tooth extraction  1995  . Cervical biopsy  w/ loop electrode excision  ~ 1999  . Laparoscopic assisted vaginal hysterectomy N/A 11/06/2014    Procedure: LAPAROSCOPIC ASSISTED VAGINAL HYSTERECTOMY;  Surgeon: Dian Queen, MD;  Location: [place];  Service: Gynecology;  Laterality: N/A;  AND VAGINA  . Salpingoophorectomy Bilateral 11/06/2014    Procedure: SALPINGO OOPHORECTOMY;  Surgeon: Dian Queen, MD;  Location: [facility];  Service: Gynecology;  Laterality: Bilateral;  AND VAGINA  . Vaginal hysterectomy    . Breast biopsy Left 01/2015  . Mastectomy Bilateral 03/26/2015    PROPHYLATIC RIGHT; "benign mass on left" (03/26/2015)  . Simple mastectomy with axillary  sentinel node biopsy Bilateral 03/26/2015    Procedure: BILATERAL MASTECTOMY, PROPHYLATIC RIGHT ;  Surgeon: Autumn Messing III, MD;  Location: [place];  Service: General;  Laterality: Bilateral;    There were no vitals filed for this visit.  Visit Diagnosis:  Stiffness of joint, shoulder region, left  Stiffness of joint, shoulder region, right  Pain in joint, shoulder region, left  Pain in joint, shoulder region, right      Subjective Assessment - 06/15/15 1109    Subjective Did a little bit of mini tennis the other day.  It burned a little later, like the nerve pain.  I took ibuprofen and it got better.  Saw Dr. [name] on Wednesday and he said I could resume normal activity, but just don't overdo it.   Currently in Pain? Yes   Pain Score 2    Pain Location Flank   Pain Orientation Right;Left   Pain Descriptors / Indicators Burning   Aggravating Factors  mini tennis   Pain Relieving Factors ibuprofen            [facility] PT Assessment - 06/15/15 0001    AROM   Right Shoulder Flexion 157 Degrees   Right Shoulder External Rotation 82 Degrees   Left Shoulder Flexion 155 Degrees   Left Shoulder ABduction 160 Degrees   Left Shoulder External Rotation 85 Degrees  [facility] Adult PT Treatment/Exercise - 06/15/15 0001    Shoulder Exercises: Standing   Other Standing Exercises wall finger ladder for abduction x 5 each side  3 lb. weight on wrist for this   Shoulder Exercises: Pulleys   Flexion 2 minutes   ABduction 2 minutes   Shoulder Exercises: Therapy Ball   Flexion 10 reps  with 3 lb. weight on each wrist   Manual Therapy   Soft tissue mobilization soft tissue mobilization at scars on both sides of chest, gently   Myofascial Release crosshands technique at bilateral chest in vertical and diagonal directions; right UE myofascial pulling with concurrent distraction at lower chest in opposite direction   Passive ROM pect minor stretches; stretch to tolerance  into shoulder ER and flexion on both sides                   Short Term Clinic Goals - 06/11/15 2200    CC Short Term Goal  #1   Title Independent in HEP for bilateral shoulder ROM   Status Achieved   CC Short Term Goal  #2   Title bilateral shoulder active flexion to at least 130 degrees for improved overhead reach   Status Achieved   CC Short Term Goal  #3   Title bilateral shoulder active abduction to at least 120 degrees for improved ADLs   Status Achieved             Long Term Clinic Goals - 06/15/15 1210    CC Long Term Goal  #1   Title bilateral shoulder active flexion to at least 150 degrees for improved overhead reach   Status Achieved   CC Long Term Goal  #2   Title bilateral shoulder active abduction to at least 150 degrees for improved ADLs   Status Achieved   CC Long Term Goal  #3   Title independent with strength ABC program and/or knowledgeable about safe self-progression of exercise program   Status On-going   CC Long Term Goal  #4   Title chest pain reduced to 2/10 or less   Status On-going   CC Long Term Goal  #5   Title bilateral active shoulder flexion to at least 165 degrees   Time 3   Period Weeks   Status New            Plan - 06/15/15 1213    Clinical Impression Statement Patient is doing very well, having made nice gains, some quite large, in shoulder ROM.  Shoulder ROM goals have been met, though she would benefit from continuing toward further increases.  Tolerating PROM and manual techniques much better.  Has right shoulder pain which is started some time ago and has episodes of back pain more on the left that seems like spasms that come and go, but partly depend on her position.   Pt will benefit from skilled therapeutic intervention in order to improve on the following deficits Decreased range of motion;Impaired UE functional use;Pain;Increased edema   Rehab Potential Excellent   Clinical Impairments Affecting Rehab Potential  surgery 03/26/15, so still recent and still healing at time of eval   PT Frequency 2x / week   PT Duration 4 weeks   PT Treatment/Interventions Therapeutic exercise;Passive range of motion;Manual techniques   PT Next Visit Plan ROM and manual techniques including soft tissue mobilization and myofascial release; manual lymph drainage for both breast areas.   Consulted and Agree with Plan of Care Patient  Problem List Patient Active Problem List   Diagnosis Date Noted  . 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 Menorrhagia 11/06/2014  . Plantar fasciitis, bilateral 03/04/2013  . Collapsed arches 03/04/2013    Munirah Doerner 06/15/2015, 12:17 PM  [place], [place], [address] Phone: [phone]   Fax:  [phone]  Name: Mackenzie Clayton MRN: [id number] Date of Birth: 1966/02/04    Serafina Royals, PT 06/15/2015 12:17 PM

## 2015-06-18 ENCOUNTER — Ambulatory Visit: Payer: BLUE CROSS/BLUE SHIELD | Admitting: Physical Therapy

## 2015-06-20 ENCOUNTER — Ambulatory Visit: Payer: BLUE CROSS/BLUE SHIELD | Attending: General Surgery | Admitting: Physical Therapy

## 2015-06-20 DIAGNOSIS — M25512 Pain in left shoulder: Secondary | ICD-10-CM

## 2015-06-20 DIAGNOSIS — M25612 Stiffness of left shoulder, not elsewhere classified: Secondary | ICD-10-CM | POA: Insufficient documentation

## 2015-06-20 DIAGNOSIS — M25611 Stiffness of right shoulder, not elsewhere classified: Secondary | ICD-10-CM | POA: Insufficient documentation

## 2015-06-20 DIAGNOSIS — M25511 Pain in right shoulder: Secondary | ICD-10-CM | POA: Diagnosis present

## 2015-06-20 DIAGNOSIS — R609 Edema, unspecified: Secondary | ICD-10-CM | POA: Insufficient documentation

## 2015-06-20 NOTE — Therapy (Signed)
[place], [place], [address] Phone: [phone]   Fax:  [phone]  Physical Therapy Treatment  Patient Details  Name: Mackenzie Clayton MRN: [id number] Date of Birth: January 07, 1966 Referring Provider: Dr. Autumn Messing  Encounter Date: 06/20/2015      PT End of Session - 06/20/15 1442    Visit Number 12   Number of Visits 18   Date for PT Re-Evaluation 07/09/15   PT Start Time 1350   PT Stop Time 1438   PT Time Calculation (min) 48 min   Activity Tolerance Patient tolerated treatment well   Behavior During Therapy [facility] for tasks assessed/performed      Past Medical History  Diagnosis Date  . Asthma   . History of gestational diabetes     with child # 5  . Menorrhagia   . Wears glasses   . PONV (postoperative nausea and vomiting)     Sick with patch in July 2016.  11/06/14 post op in room, pulse ox keep going off , not sure if she was on PCA or not.  . Family history of adverse reaction to anesthesia     Mother and Sister- N/V , sister did not get sick with Emend.    Past Surgical History  Procedure Laterality Date  . Knee arthroscopy Right 1988  . Reduction mammaplasty Bilateral 1997  . Wisdom tooth extraction  1995  . Cervical biopsy  w/ loop electrode excision  ~ 1999  . Laparoscopic assisted vaginal hysterectomy N/A 11/06/2014    Procedure: LAPAROSCOPIC ASSISTED VAGINAL HYSTERECTOMY;  Surgeon: Dian Queen, MD;  Location: [place];  Service: Gynecology;  Laterality: N/A;  AND VAGINA  . Salpingoophorectomy Bilateral 11/06/2014    Procedure: SALPINGO OOPHORECTOMY;  Surgeon: Dian Queen, MD;  Location: [facility];  Service: Gynecology;  Laterality: Bilateral;  AND VAGINA  . Vaginal hysterectomy    . Breast biopsy Left 01/2015  . Mastectomy Bilateral 03/26/2015    PROPHYLATIC RIGHT; "benign mass on left" (03/26/2015)  . Simple mastectomy with axillary  sentinel node biopsy Bilateral 03/26/2015    Procedure: BILATERAL MASTECTOMY, PROPHYLATIC RIGHT ;  Surgeon: Autumn Messing III, MD;  Location: [place];  Service: General;  Laterality: Bilateral;    There were no vitals filed for this visit.  Visit Diagnosis:  Stiffness of joint, shoulder region, left  Stiffness of joint, shoulder region, right  Pain in joint, shoulder region, left  Pain in joint, shoulder region, right      Subjective Assessment - 06/20/15 1351    Subjective Still more of the "nerve reconnections."  "I was very sore after that one."  Still interested in seeing Sports Medicine doc.  Went t yoga yesterday; "she nearly killed [place]."   Currently in Pain? Yes   Pain Score 2    Pain Location Arm  and back   Pain Orientation Right;Left   Pain Descriptors / Indicators Sore   Aggravating Factors  yoga   Pain Relieving Factors time                         [facility] Adult PT Treatment/Exercise - 06/20/15 0001    Shoulder Exercises: Supine   Other Supine Exercises over foam roller, active horizontal abduction x 10; then "V" x 10   Other Supine Exercises over foam roller, scapular retraction with isometric shoulder extension with hands on mat x 5, then relax shoulders around foam foaller  Shoulder Exercises: Standing   Other Standing Exercises robber's pose with movement, 2 lb. weight on wrists x 6   Other Standing Exercises wall finger ladder for abduction x 5 each side  2 lb. weight on wrist for this   Shoulder Exercises: Pulleys   Flexion 2 minutes   ABduction 2 minutes   Shoulder Exercises: Therapy Ball   Flexion 10 reps  with 2 lb. weight on each wrist   Manual Therapy   Soft tissue mobilization soft tissue mobilization at scars on both sides of chest, gently, in all directions   Myofascial Release crosshands technique at bilateral chest in vertical, hoizontal, and diagonal directions   Passive ROM pect minor stretches over foam roller; passive flexion on  each side with concurrent soft tissue mobilization at incisions                   Short Term Clinic Goals - 06/11/15 2200    CC Short Term Goal  #1   Title Independent in HEP for bilateral shoulder ROM   Status Achieved   CC Short Term Goal  #2   Title bilateral shoulder active flexion to at least 130 degrees for improved overhead reach   Status Achieved   CC Short Term Goal  #3   Title bilateral shoulder active abduction to at least 120 degrees for improved ADLs   Status Achieved             Long Term Clinic Goals - 06/20/15 1444    CC Long Term Goal  #3   Title independent with strength ABC program and/or knowledgeable about safe self-progression of exercise program   Status On-going   CC Long Term Goal  #4   Title chest pain reduced to 2/10 or less   Status On-going   CC Long Term Goal  #5   Title bilateral active shoulder flexion to at least 165 degrees   Status On-going            Plan - 06/20/15 1442    Clinical Impression Statement Continues to attend yoga and do well tolerating therapy sessions, now with more manual myofascial release and soft tissue mobilization.  Still with significant myofascial tightness at chest.   Pt will benefit from skilled therapeutic intervention in order to improve on the following deficits Decreased range of motion;Impaired UE functional use;Pain;Increased edema   Rehab Potential Excellent   Clinical Impairments Affecting Rehab Potential surgery 03/26/15, so still recent and still healing at time of eval   PT Frequency 2x / week   PT Duration 4 weeks   PT Treatment/Interventions Therapeutic exercise;Passive range of motion;Manual techniques   PT Next Visit Plan Remeasure.  ROM and manual techniques including soft tissue mobilization and myofascial release; manual lymph drainage for both breast areas.   Consulted and Agree with Plan of Care Patient        Problem List Patient Active Problem List   Diagnosis Date  Noted  . 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 Menorrhagia 11/06/2014  . Plantar fasciitis, bilateral 03/04/2013  . Collapsed arches 03/04/2013    Mackenzie Clayton 06/20/2015, 2:45 PM  [place], [place], [address] Phone: [phone]   Fax:  [phone]  Name: Mackenzie Clayton MRN: [id number] Date of Birth: May 16, 1965    Serafina Royals, PT 06/20/2015 2:45 PM

## 2015-06-25 ENCOUNTER — Ambulatory Visit: Payer: BLUE CROSS/BLUE SHIELD | Admitting: Physical Therapy

## 2015-06-25 DIAGNOSIS — R609 Edema, unspecified: Secondary | ICD-10-CM

## 2015-06-25 DIAGNOSIS — M25612 Stiffness of left shoulder, not elsewhere classified: Secondary | ICD-10-CM | POA: Diagnosis not present

## 2015-06-25 DIAGNOSIS — M25511 Pain in right shoulder: Secondary | ICD-10-CM

## 2015-06-25 DIAGNOSIS — M25512 Pain in left shoulder: Secondary | ICD-10-CM

## 2015-06-25 DIAGNOSIS — M25611 Stiffness of right shoulder, not elsewhere classified: Secondary | ICD-10-CM

## 2015-06-25 NOTE — Therapy (Signed)
[place], [place], [address] Phone: [phone]   Fax:  [phone]  Physical Therapy Treatment  Patient Details  Name: Mackenzie Clayton MRN: [id number] Date of Birth: [birth year] Referring Provider: Dr. Autumn Messing  Encounter Date: 06/25/2015      PT End of Session - 06/25/15 1446    Visit Number 13   Number of Visits 18   Date for PT Re-Evaluation 07/09/15   PT Start Time 1302   PT Stop Time 1349   PT Time Calculation (min) 47 min   Activity Tolerance Patient tolerated treatment well   Behavior During Therapy [facility] for tasks assessed/performed      Past Medical History  Diagnosis Date  . Asthma   . History of gestational diabetes     with child # 5  . Menorrhagia   . Wears glasses   . PONV (postoperative nausea and vomiting)     Sick with patch in July 2016.  11/06/14 post op in room, pulse ox keep going off , not sure if she was on PCA or not.  . Family history of adverse reaction to anesthesia     Mother and Sister- N/V , sister did not get sick with Emend.    Past Surgical History  Procedure Laterality Date  . Knee arthroscopy Right 1988  . Reduction mammaplasty Bilateral 1997  . Wisdom tooth extraction  1995  . Cervical biopsy  w/ loop electrode excision  ~ 1999  . Laparoscopic assisted vaginal hysterectomy N/A 11/06/2014    Procedure: LAPAROSCOPIC ASSISTED VAGINAL HYSTERECTOMY;  Surgeon: Dian Queen, MD;  Location: [place];  Service: Gynecology;  Laterality: N/A;  AND VAGINA  . Salpingoophorectomy Bilateral 11/06/2014    Procedure: SALPINGO OOPHORECTOMY;  Surgeon: Dian Queen, MD;  Location: [facility];  Service: Gynecology;  Laterality: Bilateral;  AND VAGINA  . Vaginal hysterectomy    . Breast biopsy Left 01/2015  . Mastectomy Bilateral 03/26/2015    PROPHYLATIC RIGHT; "benign mass on left" (03/26/2015)  . Simple mastectomy with axillary  sentinel node biopsy Bilateral 03/26/2015    Procedure: BILATERAL MASTECTOMY, PROPHYLATIC RIGHT ;  Surgeon: Autumn Messing III, MD;  Location: [place];  Service: General;  Laterality: Bilateral;    There were no vitals filed for this visit.  Visit Diagnosis:  Stiffness of joint, shoulder region, left  Stiffness of joint, shoulder region, right  Pain in joint, shoulder region, left  Pain in joint, shoulder region, right  Postoperative edema      Subjective Assessment - 06/25/15 1304    Subjective "It was tough" after the last visit--pretty sore.  Nerve pain gets worse after she's been using them.  Has been trying to push herself a little more at home.   Currently in Pain? No/denies                         [facility] Adult PT Treatment/Exercise - 06/25/15 0001    Shoulder Exercises: Standing   Other Standing Exercises robber's pose with movement, 3 lb. weight on wrists x 5   Other Standing Exercises wall finger ladder for abduction x 5 each side  3 lb. weight on wrist for this   Shoulder Exercises: Pulleys   Flexion 3 minutes   ABduction 3 minutes   Shoulder Exercises: Therapy Ball   Flexion 10 reps  with 3 lb. weight on each wrist   Manual Therapy   Soft tissue  mobilization soft tissue mobilization at scars on both sides of chest, gently, in all directions   Myofascial Release crosshands technique at bilateral chest in vertical and diagonal directions, especially at lateral aspect of incisions   Manual Lymphatic Drainage (MLD) short neck, right groin and axillo-inguinal anastomosis, and same on left side, with extra time spent at indurated areas around mastectomy scars on both sides.                   Short Term Clinic Goals - 06/11/15 2200    CC Short Term Goal  #1   Title Independent in HEP for bilateral shoulder ROM   Status Achieved   CC Short Term Goal  #2   Title bilateral shoulder active flexion to at least 130 degrees for improved overhead reach    Status Achieved   CC Short Term Goal  #3   Title bilateral shoulder active abduction to at least 120 degrees for improved ADLs   Status Achieved             Long Term Clinic Goals - 06/20/15 1444    CC Long Term Goal  #3   Title independent with strength ABC program and/or knowledgeable about safe self-progression of exercise program   Status On-going   CC Long Term Goal  #4   Title chest pain reduced to 2/10 or less   Status On-going   CC Long Term Goal  #5   Title bilateral active shoulder flexion to at least 165 degrees   Status On-going            Plan - 06/25/15 1446    Clinical Impression Statement Patient doing well, tolerating increasingly more stretching; she does report soreness after sessions, but it lasts just a day or so.  Still with some right shoulder pain that predates her  mastectomies.  Right chest area just inferior to mastectomy scar is now less full /indurated than it had been; left still with some fullness.   Pt will benefit from skilled therapeutic intervention in order to improve on the following deficits Decreased range of motion;Impaired UE functional use;Pain;Increased edema   Rehab Potential Excellent   PT Frequency 2x / week   PT Duration 4 weeks   PT Treatment/Interventions Therapeutic exercise;Passive range of motion;Manual techniques   PT Next Visit Plan Remeasure.  ROM and manual techniques including soft tissue mobilization and myofascial release; manual lymph drainage for both breast areas.   Consulted and Agree with Plan of Care Patient        Problem List Patient Active Problem List   Diagnosis Date Noted  . 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 Menorrhagia 11/06/2014  . Plantar fasciitis, bilateral 03/04/2013  . Collapsed arches 03/04/2013    Ralonda Tartt 06/25/2015, 2:49 PM  [place], [place], [address] Phone: [phone]   Fax:   [phone]  Name: Mackenzie Clayton MRN: [id number] Date of Birth: March 17, 1966    Serafina Royals, PT 06/25/2015 2:49 PM

## 2015-06-27 ENCOUNTER — Ambulatory Visit: Payer: BLUE CROSS/BLUE SHIELD | Admitting: Physical Therapy

## 2015-06-27 DIAGNOSIS — R609 Edema, unspecified: Secondary | ICD-10-CM

## 2015-06-27 DIAGNOSIS — M25511 Pain in right shoulder: Secondary | ICD-10-CM

## 2015-06-27 DIAGNOSIS — M25611 Stiffness of right shoulder, not elsewhere classified: Secondary | ICD-10-CM

## 2015-06-27 DIAGNOSIS — M25512 Pain in left shoulder: Secondary | ICD-10-CM

## 2015-06-27 DIAGNOSIS — M25612 Stiffness of left shoulder, not elsewhere classified: Secondary | ICD-10-CM

## 2015-06-27 NOTE — Therapy (Signed)
[place], [place], [address] Phone: [phone]   Fax:  [phone]  Physical Therapy Treatment  Patient Details  Name: Mackenzie Clayton MRN: [id number] Date of Birth: Jun 23, 1965 Referring Provider: Dr. Autumn Messing  Encounter Date: 06/27/2015      PT End of Session - 06/27/15 1704    Visit Number 14   Number of Visits 18   Date for PT Re-Evaluation 07/09/15   PT Start Time 1350   PT Stop Time 1440   PT Time Calculation (min) 50 min   Activity Tolerance Patient tolerated treatment well   Behavior During Therapy [facility] for tasks assessed/performed      Past Medical History  Diagnosis Date  . Asthma   . History of gestational diabetes     with child # 5  . Menorrhagia   . Wears glasses   . PONV (postoperative nausea and vomiting)     Sick with patch in July 2016.  11/06/14 post op in room, pulse ox keep going off , not sure if she was on PCA or not.  . Family history of adverse reaction to anesthesia     Mother and Sister- N/V , sister did not get sick with Emend.    Past Surgical History  Procedure Laterality Date  . Knee arthroscopy Right 1988  . Reduction mammaplasty Bilateral 1997  . Wisdom tooth extraction  1995  . Cervical biopsy  w/ loop electrode excision  ~ 1999  . Laparoscopic assisted vaginal hysterectomy N/A 11/06/2014    Procedure: LAPAROSCOPIC ASSISTED VAGINAL HYSTERECTOMY;  Surgeon: Dian Queen, MD;  Location: [place];  Service: Gynecology;  Laterality: N/A;  AND VAGINA  . Salpingoophorectomy Bilateral 11/06/2014    Procedure: SALPINGO OOPHORECTOMY;  Surgeon: Dian Queen, MD;  Location: [facility];  Service: Gynecology;  Laterality: Bilateral;  AND VAGINA  . Vaginal hysterectomy    . Breast biopsy Left 01/2015  . Mastectomy Bilateral 03/26/2015    PROPHYLATIC RIGHT; "benign mass on left" (03/26/2015)  . Simple mastectomy with axillary  sentinel node biopsy Bilateral 03/26/2015    Procedure: BILATERAL MASTECTOMY, PROPHYLATIC RIGHT ;  Surgeon: Autumn Messing III, MD;  Location: [place];  Service: General;  Laterality: Bilateral;    There were no vitals filed for this visit.  Visit Diagnosis:  Stiffness of joint, shoulder region, left  Stiffness of joint, shoulder region, right  Pain in joint, shoulder region, left  Pain in joint, shoulder region, right  Postoperative edema      Subjective Assessment - 06/27/15 1352    Subjective "My left side was really inflamed yesterday.  It felt puffy."  Still a little inflamed today.  It wasn't painful, it just wasn't comfortable.  "Nerve pain is like having a sunburn and somebody putting needles in the sunburn."   Currently in Pain? Yes   Pain Location Chest   Pain Orientation Left;Right   Pain Descriptors / Indicators Sore   Aggravating Factors  feels puffy   Pain Relieving Factors tylenol                         [facility] Adult PT Treatment/Exercise - 06/27/15 0001    Shoulder Exercises: Standing   Other Standing Exercises yellow Theraband at top of door:  pt. mimics tossing tennis ball up with left hand and swinging tennis racket (Theraband) forward and down with right x 10, then same with left.  Then Theraband  in side of door and patient mimicing a forward hand motions with tennis racket but using Theraband for resistance x 10 each side.  Then one end of Theraband under foot and patient swinging arm up agains resistance x 10 each side.  Then Theraband in side of door again and patient lifting in backhand motion up and out x 10 each side.     Other Standing Exercises wall finger ladder for abduction x 5 each side.  Tossed 0.5 kg. ball at rebounder with right arm x 10 then more overhand x 10 more; then left hand x 10  3 lb. weight on wrist for this   Shoulder Exercises: Pulleys   Flexion 2 minutes   ABduction 2 minutes   Shoulder Exercises: Therapy Ball   Flexion 10  reps  with 3 lb. weight on each wrist   Manual Therapy   Soft tissue mobilization soft tissue mobilization at scars on both sides of chest, gently, in all directions   Manual Lymphatic Drainage (MLD) short neck, superficial and deep abdomen, right groin and axillo-inguinal anastomosis, and same on left side, with extra time spent at indurated areas around mastectomy scars on both sides.                PT Education - 06/27/15 1704    Education provided Yes   Education Details in using Theraband in doorway to work on tennis mimicking exercise with light resistance   Person(s) Educated Patient   Methods Explanation;Demonstration   Comprehension Verbalized understanding;Returned demonstration           Short Term Clinic Goals - 06/11/15 2200    CC Short Term Goal  #1   Title Independent in HEP for bilateral shoulder ROM   Status Achieved   CC Short Term Goal  #2   Title bilateral shoulder active flexion to at least 130 degrees for improved overhead reach   Status Achieved   CC Short Term Goal  #3   Title bilateral shoulder active abduction to at least 120 degrees for improved ADLs   Status Achieved             Long Term Clinic Goals - 06/27/15 1715    CC Long Term Goal  #3   Title independent with strength ABC program and/or knowledgeable about safe self-progression of exercise program   Status On-going   CC Long Term Goal  #4   Title chest pain reduced to 2/10 or less   Status On-going   CC Long Term Goal  #5   Title bilateral active shoulder flexion to at least 165 degrees   Status On-going            Plan - 06/27/15 1707    Clinical Impression Statement Had some increased feeling of puffiness at left chest after last session.  It does feel more full to palpation today.  She was able to do some mild resistive exercises today with both UEs and was asked to monitor her response to this over the next couple of days.   Pt will benefit from skilled  therapeutic intervention in order to improve on the following deficits Decreased range of motion;Impaired UE functional use;Pain;Increased edema   Rehab Potential Excellent   PT Frequency 2x / week   PT Duration 4 weeks   PT Treatment/Interventions Therapeutic exercise;Manual techniques   PT Next Visit Plan Remeasure.  Continue strengthening if well tolerated.  ROM and manual techniques including soft tissue mobilization and myofascial release; manual lymph  drainage for both breast areas.   Consulted and Agree with Plan of Care Patient        Problem List Patient Active Problem List   Diagnosis Date Noted  . 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 Menorrhagia 11/06/2014  . Plantar fasciitis, bilateral 03/04/2013  . Collapsed arches 03/04/2013    [name],[name] 06/27/2015, 5:16 PM  [place], [place], [address] Phone: [phone]   Fax:  [phone]  Name: GENECE WAHLERT MRN: [id number] Date of Birth: 1965-07-26    Serafina Royals, PT 06/27/2015 5:16 PM

## 2015-07-02 ENCOUNTER — Ambulatory Visit: Payer: BLUE CROSS/BLUE SHIELD | Admitting: Physical Therapy

## 2015-07-02 DIAGNOSIS — M25612 Stiffness of left shoulder, not elsewhere classified: Secondary | ICD-10-CM

## 2015-07-02 DIAGNOSIS — M25511 Pain in right shoulder: Secondary | ICD-10-CM

## 2015-07-02 DIAGNOSIS — M25611 Stiffness of right shoulder, not elsewhere classified: Secondary | ICD-10-CM

## 2015-07-02 DIAGNOSIS — M25512 Pain in left shoulder: Secondary | ICD-10-CM

## 2015-07-02 NOTE — Therapy (Signed)
[place], [place], [address] Phone: [phone]   Fax:  [phone]  Physical Therapy Treatment  Patient Details  Name: Mackenzie Clayton MRN: [id number] Date of Birth: 1966-03-10 Referring Provider: Dr. Autumn Messing  Encounter Date: 07/02/2015      PT End of Session - 07/02/15 1443    Visit Number 15   Number of Visits 18   Date for PT Re-Evaluation 07/09/15   PT Start Time [id number]   PT Stop Time 1433   PT Time Calculation (min) 45 min   Activity Tolerance Patient tolerated treatment well   Behavior During Therapy [facility] for tasks assessed/performed      Past Medical History  Diagnosis Date  . Asthma   . History of gestational diabetes     with child # 5  . Menorrhagia   . Wears glasses   . PONV (postoperative nausea and vomiting)     Sick with patch in July 2016.  11/06/14 post op in room, pulse ox keep going off , not sure if she was on PCA or not.  . Family history of adverse reaction to anesthesia     Mother and Sister- N/V , sister did not get sick with Emend.    Past Surgical History  Procedure Laterality Date  . Knee arthroscopy Right 1988  . Reduction mammaplasty Bilateral 1997  . Wisdom tooth extraction  1995  . Cervical biopsy  w/ loop electrode excision  ~ 1999  . Laparoscopic assisted vaginal hysterectomy N/A 11/06/2014    Procedure: LAPAROSCOPIC ASSISTED VAGINAL HYSTERECTOMY;  Surgeon: Dian Queen, MD;  Location: [place];  Service: Gynecology;  Laterality: N/A;  AND VAGINA  . Salpingoophorectomy Bilateral 11/06/2014    Procedure: SALPINGO OOPHORECTOMY;  Surgeon: Dian Queen, MD;  Location: [facility];  Service: Gynecology;  Laterality: Bilateral;  AND VAGINA  . Vaginal hysterectomy    . Breast biopsy Left 01/2015  . Mastectomy Bilateral 03/26/2015    PROPHYLATIC RIGHT; "benign mass on left" (03/26/2015)  . Simple mastectomy with axillary  sentinel node biopsy Bilateral 03/26/2015    Procedure: BILATERAL MASTECTOMY, PROPHYLATIC RIGHT ;  Surgeon: Autumn Messing III, MD;  Location: [place];  Service: General;  Laterality: Bilateral;    There were no vitals filed for this visit.  Visit Diagnosis:  Stiffness of joint, shoulder region, left  Stiffness of joint, shoulder region, right  Pain in joint, shoulder region, left  Pain in joint, shoulder region, right      Subjective Assessment - 07/02/15 1349    Subjective Heat went out on the other side of the house.  Was sore (uncomfortable) after last visit for about a day; took Tylenol.   Currently in Pain? Yes   Pain Score 1    Pain Location Flank  at drain sites   Pain Orientation Left;Right   Pain Descriptors / Indicators --  uncomfortable   Aggravating Factors  sleeping on her side   Pain Relieving Factors Tylenol            [facility] PT Assessment - 07/02/15 0001    AROM   Right Shoulder Flexion 157 Degrees  sitting   Right Shoulder External Rotation 90 Degrees  in supine after stretching   Left Shoulder Flexion 158 Degrees   Left Shoulder ABduction 160 Degrees   Left Shoulder External Rotation 90 Degrees  in supine after stretching  [facility] Adult PT Treatment/Exercise - 07/02/15 0001    Shoulder Exercises: Standing   Other Standing Exercises robber's pose with movement, 3 lb. weight on wrists x 5   Other Standing Exercises wall finger ladder for abduction x 5 each side.  Tossed 0.5 kg. ball at rebounder with right arm x 10; x then left hand x 10, then 10x more each side  3 lb. weight on wrist for this   Shoulder Exercises: Pulleys   Flexion 2 minutes   ABduction 2 minutes   Shoulder Exercises: Therapy Ball   Flexion 10 reps  with 3 lb. weight on each wrist   Shoulder Exercises: ROM/Strengthening   Other ROM/Strengthening Exercises lat pulldown with 20# x 10   Manual Therapy   Soft tissue mobilization soft tissue mobilization at  scars on both sides of chest, gently, in all directions   Myofascial Release crosshands technique at right and left chest in vertical directions   Passive ROM into ER at both shoulders                   Short Term Clinic Goals - 06/11/15 2200    CC Short Term Goal  #1   Title Independent in HEP for bilateral shoulder ROM   Status Achieved   CC Short Term Goal  #2   Title bilateral shoulder active flexion to at least 130 degrees for improved overhead reach   Status Achieved   CC Short Term Goal  #3   Title bilateral shoulder active abduction to at least 120 degrees for improved ADLs   Status Achieved             Long Term Clinic Goals - 07/02/15 1447    CC Long Term Goal  #3   Title independent with strength ABC program and/or knowledgeable about safe self-progression of exercise program   Status On-going            Plan - 07/02/15 1446    Clinical Impression Statement Patient continues gradual progress with ROm and strengthening.  ROM improved some in both shoulders today; new strengthening done as well.  She tends to be sore for a day after therapy, but last time it wasn't bad and just lasted the one day.   Pt will benefit from skilled therapeutic intervention in order to improve on the following deficits Decreased range of motion;Impaired UE functional use;Pain;Increased edema   Rehab Potential Excellent   Clinical Impairments Affecting Rehab Potential surgery 03/26/15, so still recent and still healing at time of eval   PT Frequency 2x / week   PT Duration 4 weeks   PT Treatment/Interventions Therapeutic exercise;Manual techniques   PT Next Visit Plan Check goals. Continue strengthening if well tolerated.  ROM and manual techniques including soft tissue mobilization and myofascial release; manual lymph drainage for both breast areas.   Consulted and Agree with Plan of Care Patient        Problem List Patient Active Problem List   Diagnosis Date Noted   . 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 Menorrhagia 11/06/2014  . Plantar fasciitis, bilateral 03/04/2013  . Collapsed arches 03/04/2013    Mackenzie Clayton 07/02/2015, 2:49 PM  [place], [place], [address] Phone: [phone]   Fax:  [phone]  Name: Mackenzie Clayton MRN: [id number] Date of Birth: 1965/05/22    Serafina Royals, PT 07/02/2015 2:49 PM

## 2015-07-04 ENCOUNTER — Ambulatory Visit: Payer: BLUE CROSS/BLUE SHIELD | Admitting: Physical Therapy

## 2015-07-04 DIAGNOSIS — M25512 Pain in left shoulder: Secondary | ICD-10-CM

## 2015-07-04 DIAGNOSIS — M25511 Pain in right shoulder: Secondary | ICD-10-CM

## 2015-07-04 DIAGNOSIS — M25611 Stiffness of right shoulder, not elsewhere classified: Secondary | ICD-10-CM

## 2015-07-04 DIAGNOSIS — M25612 Stiffness of left shoulder, not elsewhere classified: Secondary | ICD-10-CM | POA: Diagnosis not present

## 2015-07-04 NOTE — Therapy (Addendum)
[place], [place], [address] Phone: [phone]   Fax:  [phone]  Physical Therapy Treatment  Patient Details  Name: AUBRIELLE STROUD MRN: [id number] Date of Birth: 03-09-1966 Referring Provider: Dr. Autumn Messing  Encounter Date: 07/04/2015      PT End of Session - 07/04/15 1442    Visit Number 16   Number of Visits 20   Date for PT Re-Evaluation 08/04/15   PT Start Time 1347   PT Stop Time 1434   PT Time Calculation (min) 47 min   Activity Tolerance Patient tolerated treatment well   Behavior During Therapy [facility] for tasks assessed/performed      Past Medical History  Diagnosis Date  . Asthma   . History of gestational diabetes     with child # 5  . Menorrhagia   . Wears glasses   . PONV (postoperative nausea and vomiting)     Sick with patch in July 2016.  11/06/14 post op in room, pulse ox keep going off , not sure if she was on PCA or not.  . Family history of adverse reaction to anesthesia     Mother and Sister- N/V , sister did not get sick with Emend.    Past Surgical History  Procedure Laterality Date  . Knee arthroscopy Right 1988  . Reduction mammaplasty Bilateral 1997  . Wisdom tooth extraction  1995  . Cervical biopsy  w/ loop electrode excision  ~ 1999  . Laparoscopic assisted vaginal hysterectomy N/A 11/06/2014    Procedure: LAPAROSCOPIC ASSISTED VAGINAL HYSTERECTOMY;  Surgeon: Dian Queen, MD;  Location: [place];  Service: Gynecology;  Laterality: N/A;  AND VAGINA  . Salpingoophorectomy Bilateral 11/06/2014    Procedure: SALPINGO OOPHORECTOMY;  Surgeon: Dian Queen, MD;  Location: [facility];  Service: Gynecology;  Laterality: Bilateral;  AND VAGINA  . Vaginal hysterectomy    . Breast biopsy Left 01/2015  . Mastectomy Bilateral 03/26/2015    PROPHYLATIC RIGHT; "benign mass on left" (03/26/2015)  . Simple mastectomy with axillary  sentinel node biopsy Bilateral 03/26/2015    Procedure: BILATERAL MASTECTOMY, PROPHYLATIC RIGHT ;  Surgeon: Autumn Messing III, MD;  Location: [place];  Service: General;  Laterality: Bilateral;    There were no vitals filed for this visit.  Visit Diagnosis:  Stiffness of joint, shoulder region, left - Plan: PT plan of care cert/re-cert  Stiffness of joint, shoulder region, right - Plan: PT plan of care cert/re-cert  Pain in joint, shoulder region, left - Plan: PT plan of care cert/re-cert  Pain in joint, shoulder region, right - Plan: PT plan of care cert/re-cert      Subjective Assessment - 07/04/15 1351    Subjective Got just a little sore after last visit--maybe a 1/10.   Currently in Pain? No/denies            [facility] PT Assessment - 07/04/15 0001    AROM   Right Shoulder Flexion 158 Degrees   Left Shoulder Flexion 161 Degrees   Left Shoulder ABduction 163 Degrees                     [facility] Adult PT Treatment/Exercise - 07/04/15 0001    Shoulder Exercises: Standing   Other Standing Exercises yellow Theraband at top of door:  pt. mimics tossing tennis ball up with left hand and swinging tennis racket (Theraband) forward and down with right x 10, then same with left.  Then Theraband in side of door and patient mimicing a forward hand motions with tennis racket but using Theraband for resistance x 10 each side.  Then Theraband in side of door again and patient lifting in backhand motion up and out x 10 each side.  Robber's pose with 3 lb. weights, then "W" motion x 5   Shoulder Exercises: Pulleys   Flexion 2 minutes   ABduction 2 minutes   Shoulder Exercises: Therapy Ball   Flexion 10 reps  with 3 lb. weight on each wrist   ABduction 5 reps  each side, 3 lb. weight on wrists   Manual Therapy   Soft tissue mobilization soft tissue mobilization at scars on both sides of chest, gently, in all directions   Myofascial Release crosshands technique at right and left chest in  vertical directions   Passive ROM into flexion in both shoulders, and with opposite direction flank stretch concurrently                   Short Term Clinic Goals - 06/11/15 2200    CC Short Term Goal  #1   Title Independent in HEP for bilateral shoulder ROM   Status Achieved   CC Short Term Goal  #2   Title bilateral shoulder active flexion to at least 130 degrees for improved overhead reach   Status Achieved   CC Short Term Goal  #3   Title bilateral shoulder active abduction to at least 120 degrees for improved ADLs   Status Achieved             Long Term Clinic Goals - 07/04/15 1410    CC Long Term Goal  #1   Title bilateral shoulder active flexion to at least 150 degrees for improved overhead reach   Status Achieved   CC Long Term Goal  #2   Title bilateral shoulder active abduction to at least 150 degrees for improved ADLs   Status Achieved   CC Long Term Goal  #3   Title independent with strength ABC program and/or knowledgeable about safe self-progression of exercise program   Status Partially Met   CC Long Term Goal  #4   Title chest pain reduced to 2/10 or less   Baseline still with  more pain after therapy sessions   Status Partially Met   CC Long Term Goal  #5   Title bilateral active shoulder flexion to at least 165 degrees   Status On-going            Plan - 07/04/15 1443    Clinical Impression Statement Patient doing much improved with tolerating manual stretches and finding challenge in strengthening exercises mimicking tennis moves.  We talked about her trying more tennis strokes when the weather warms up, but to do gradually increasing lengths of time playing, and not to get back into match play yet.  Shoulder AROM measurements continuing to increase gradually.   Pt will benefit from skilled therapeutic intervention in order to improve on the following deficits Decreased range of motion;Impaired UE functional use;Pain;Increased edema    Rehab Potential Excellent   PT Frequency 1x / week   PT Duration 3 weeks  to 4 weeks prn   PT Treatment/Interventions Therapeutic exercise;Manual techniques;Passive range of motion   PT Next Visit Plan Progress strengthening.  ROM and manual techniques including soft tissue mobilization and myofascial release; manual lymph drainage for both breast areas.   PT Home Exercise Plan yellow Theraband tennis strengthening  Consulted and Agree with Plan of Care Patient        Problem List Patient Active Problem List   Diagnosis Date Noted  . 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 Menorrhagia 11/06/2014  . Plantar fasciitis, bilateral 03/04/2013  . Collapsed arches 03/04/2013    Corbyn Steedman 07/04/2015, 2:51 PM  [place], [place], [address] Phone: [phone]   Fax:  [phone]  Name: TAMECKA MILHAM MRN: [id number] Date of Birth: October 15, 1965    Serafina Royals, PT 07/04/2015 2:51 PM  PHYSICAL THERAPY DISCHARGE SUMMARY  Visits from Start of Care: 16  Current functional level related to goals / functional outcomes: Goals were partially met when patient was last seen in March, as noted above.   Remaining deficits: Unknown beyond notes above. Patient had planned to return for further therapy, but then decided she was doing well enough and didn't need to continue.   Education / Equipment: Home exercise program, info on progressing her tennis playing. Plan: Patient agrees to discharge.  Patient goals were partially met. Patient is being discharged due to being pleased with the current functional level.  ?????    Serafina Royals, PT 03/18/16 11:42 AM

## 2015-07-11 ENCOUNTER — Ambulatory Visit: Payer: BLUE CROSS/BLUE SHIELD | Admitting: Physical Therapy

## 2015-07-18 ENCOUNTER — Ambulatory Visit: Payer: BLUE CROSS/BLUE SHIELD | Admitting: Physical Therapy

## 2015-07-25 ENCOUNTER — Encounter: Payer: BLUE CROSS/BLUE SHIELD | Admitting: Physical Therapy

## 2017-02-06 IMAGING — MG MM DIGITAL DIAGNOSTIC UNILAT*L*
2 series · 2 of 2 positions shown · non-contrast
Comparison: Previous exam(s).

CLINICAL DATA: Status post MRI guided core biopsy left breast
enhancement.

EXAM:
DIAGNOSTIC LEFT MAMMOGRAM POST MRI BIOPSY

[L CC]
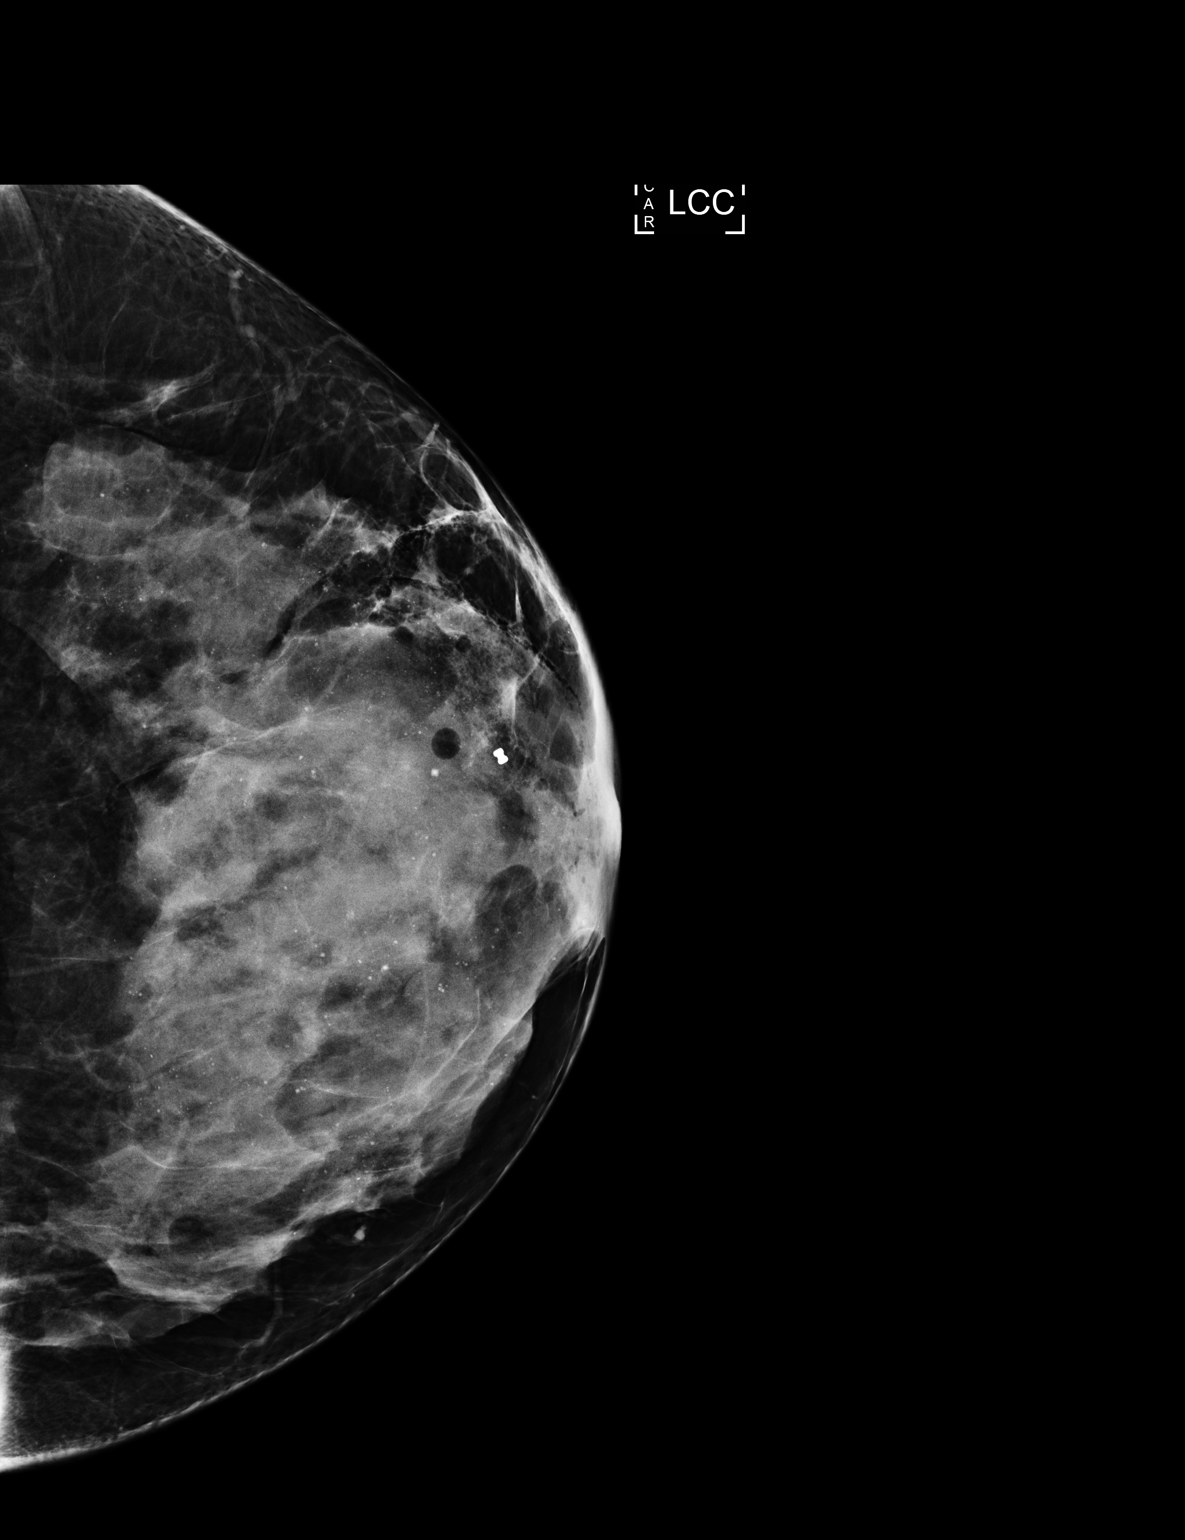

[L ML]
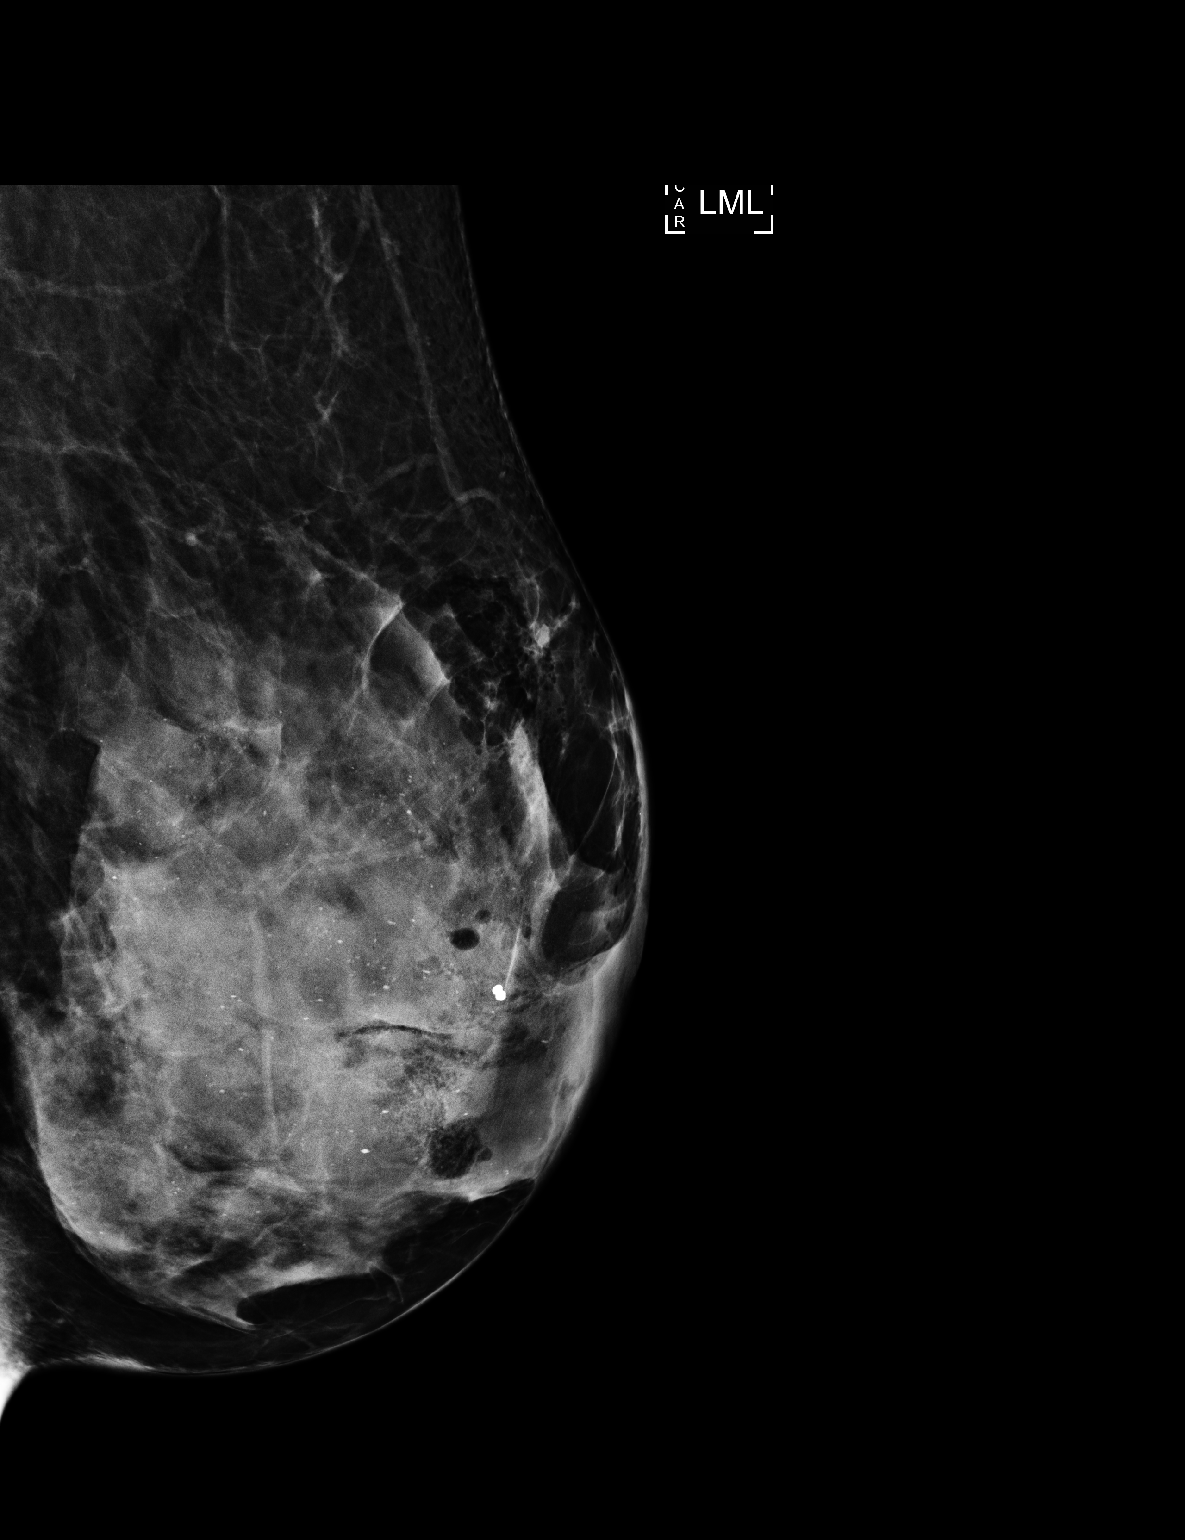

[2 of 2 positions shown; findings below may reference images not displayed]

FINDINGS: Mammographic images were obtained following MRI guided biopsy of
abnormal enhancement in the lateral lower left breast. Cc and
lateral views of the left breast demonstrate biopsy clip in the area
of concern.
IMPRESSION: Post biopsy mammogram demonstrating biopsy clip in the area of
concern.

Final Assessment: Post Procedure Mammograms for Marker Placement

## 2017-04-13 IMAGING — US US BREAST LTD UNI LEFT INC AXILLA
1 series · 5 of 5 positions shown · non-contrast
Comparison: Previous exam(s).

CLINICAL DATA: Patient presents today with palpable abnormalities
in each breast. Patient feels right breast lump at the 7 o'clock
axis. Ordering physician feels a right breast lump at the 5 o'clock
axis and a left breast lump at the 3 o'clock axis.

EXAM:
DIGITAL DIAGNOSTIC BILATERAL MAMMOGRAM WITH 3D TOMOSYNTHESIS WITH
CAD
ULTRASOUND BILATERAL BREAST

[Series 1: us breast ltd uni left inc axilla · 0.07mm/px · 5 of 5 slices shown]
[im 1/5]
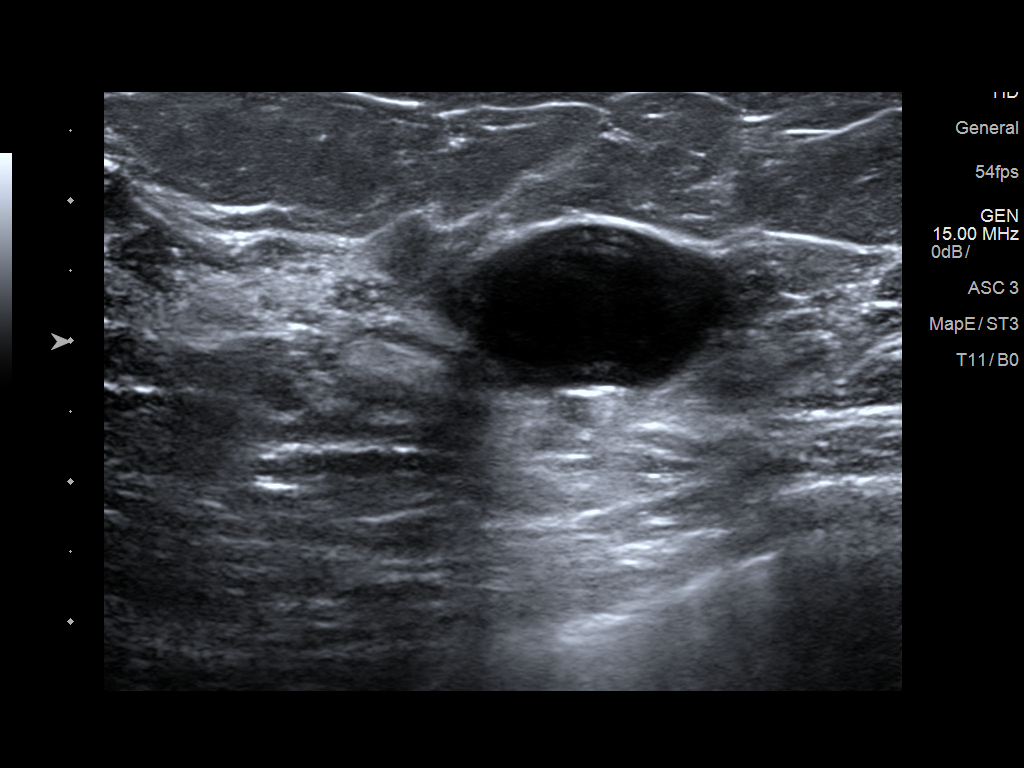
[im 2/5]
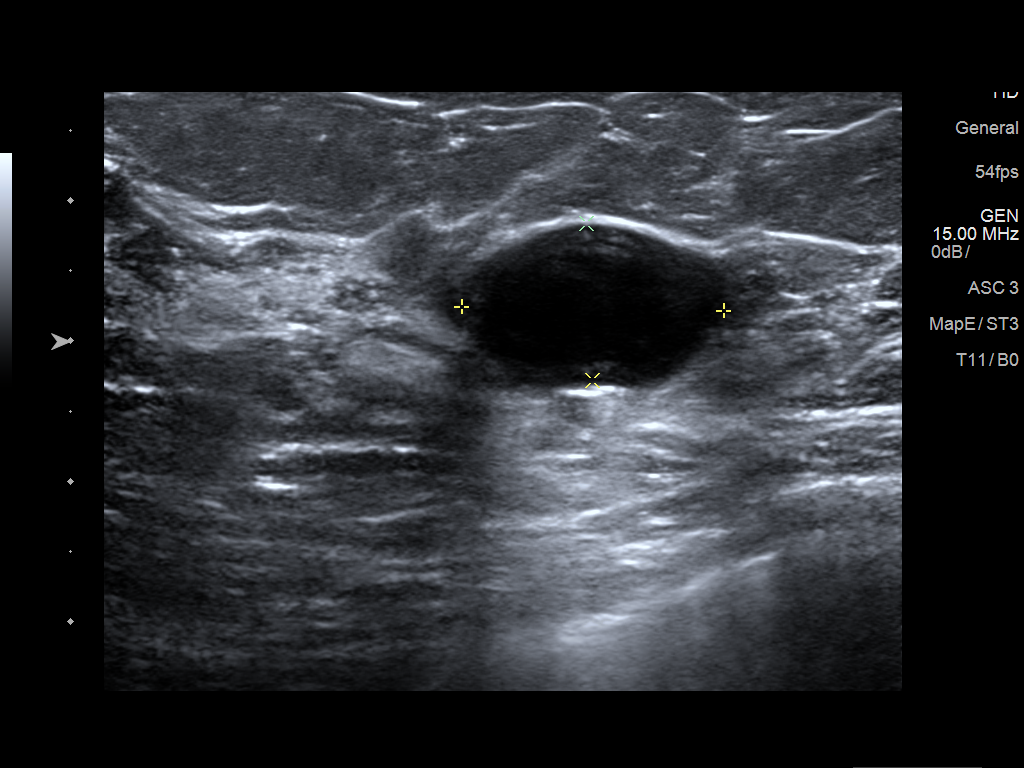
[im 3/5]
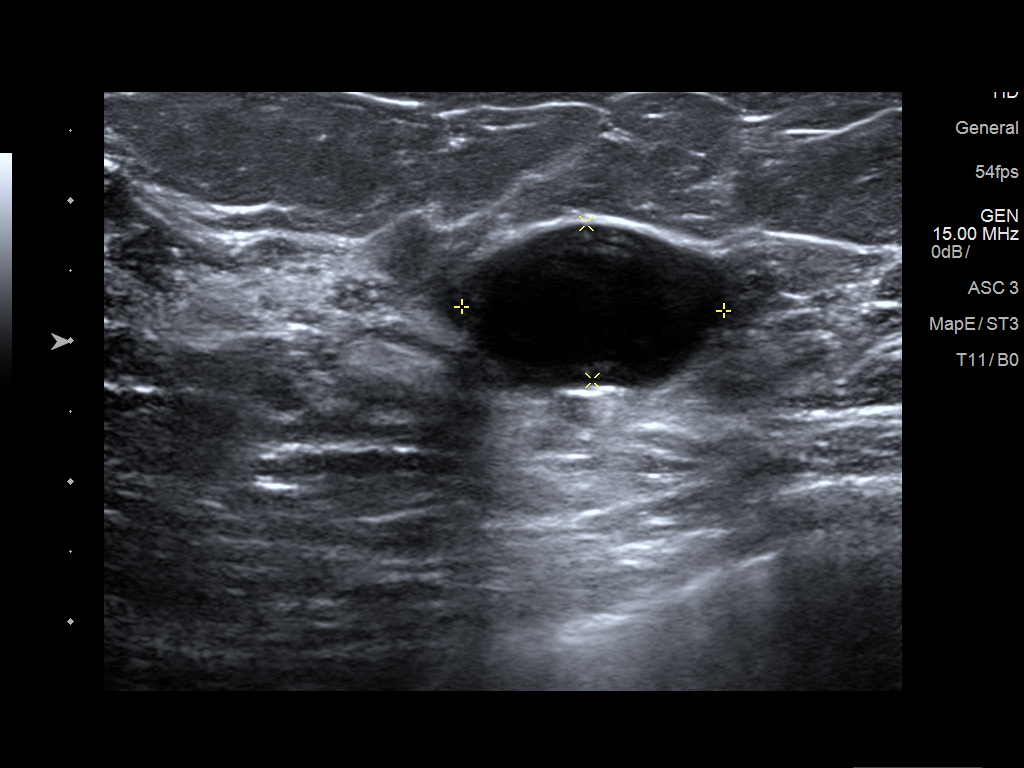
[im 4/5]
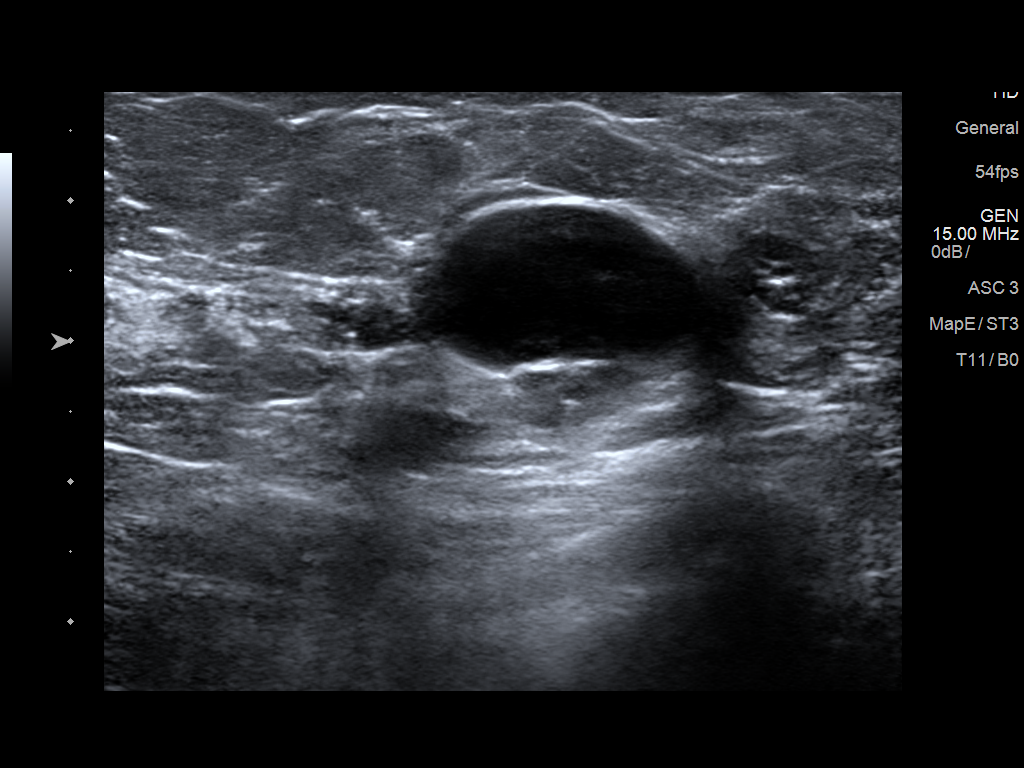
[im 5/5]
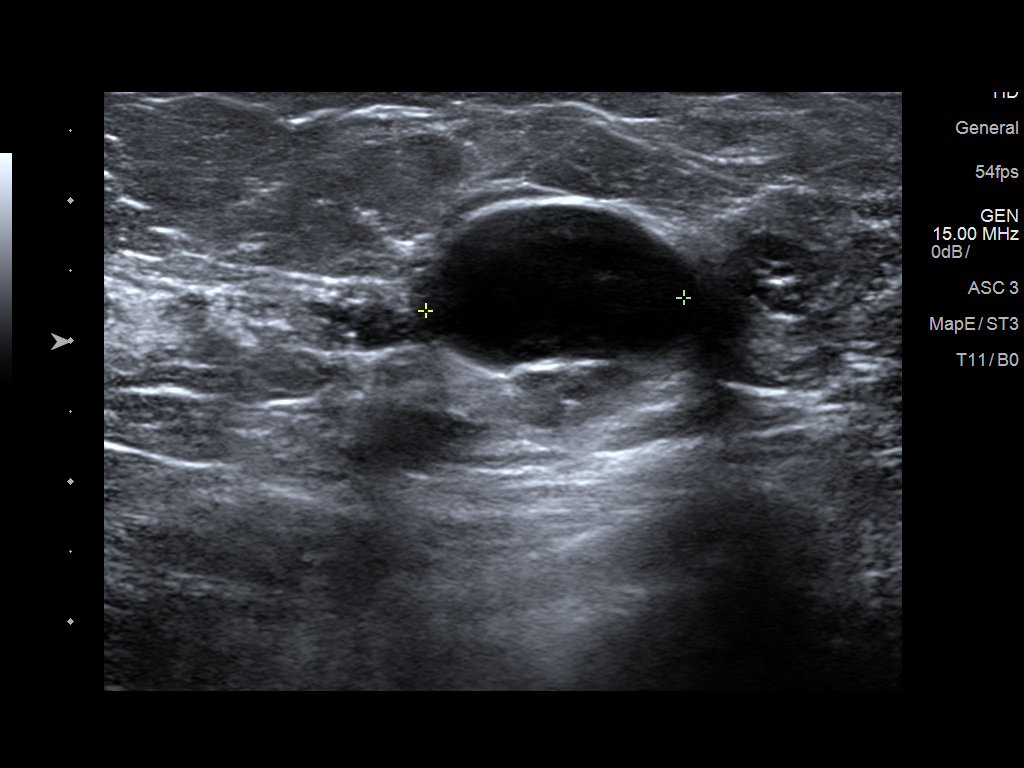

[5 of 5 positions shown; findings below may reference images not displayed]

ACR Breast Density Category d: The breast tissue is extremely dense,
which lowers the sensitivity of mammography.
FINDINGS: There are multiple bilateral circumscribed masses indicating
benignity. The mass within the right breast, lower inner quadrant,
has enlarged compared to previous exams. The palpable mass within
the outer left breast has also enlarged compared to previous
mammograms.

There are scattered benign-appearing calcifications within each
breast which are stable.

There are no suspicious findings within either breast.

Mammographic images were processed with CAD.

Targeted ultrasound is performed of these areas of palpable
abnormality within each breast, showing a simple cyst within the
right breast at the 7 o'clock axis, 3 cm from nipple, measuring
x 1.8 x 2.3 cm, correlating with the palpable abnormality and
mammographic finding. Additional simple cyst at the 6 o'clock axis,
1 cm from the nipple, measures 2.4 x 1.6 x 1.8 cm. Simple cyst
within the right breast at the 5 o'clock axis, 1 cm from nipple,
measures 2.4 x 1.3 cm, corresponding to the palpable abnormality and
mammographic finding. Additional smaller scattered benign cysts,
some simple and some complicated, also noted within the lower right
breast.

Within the left breast at the 3 o'clock axis, 5 cm from the nipple,
there is an oval simple cyst measuring 1.9 x 1.1 x 1.8 cm,
corresponding to the mammographic finding and palpable abnormality.
Scattered additional benign cysts, some simple and some complicated,
are seen within the outer left breast.

There are no suspicious solid or cystic masses identified within
either breast.
IMPRESSION: Numerous benign cysts within each breast, several corresponding to
the areas of palpable abnormality and mammographic findings.

No mammographic or sonographic evidence of malignancy within either
breast.

RECOMMENDATION:
Screening mammogram in one year.(Code:A7-5-FJC).

Also recommend routine annual screening breast MRI.

Patient informs me that she has a family history (sister) of breast
cancer. Patient's lifetime risk assessment calculation is greater
than 20%, per the patient, and patient has an upcoming screening
breast MRI scheduled.

I have discussed the findings and recommendations with the patient.
Results were also provided in writing at the conclusion of the
visit. If applicable, a reminder letter will be sent to the patient
regarding the next appointment.

BI-RADS CATEGORY  2: Benign.

## 2017-04-27 IMAGING — US US BREAST LTD UNI LEFT INC AXILLA
1 series · 9 of 9 positions shown · non-contrast
Comparison: Previous exams including 01/17/2015 MRI

CLINICAL DATA: Second-look ultrasound after MRI to determine if
2.5cm focus of suspicious enhancement in the lower outer quadrant of
the left breast is sonographically visible. Family history of breast
carcinoma.

EXAM:
ULTRASOUND OF THE LEFT BREAST

[Series 1: us breast ltd uni left inc axilla · 0.07mm/px · 9 of 9 slices shown]
[im 1/9]
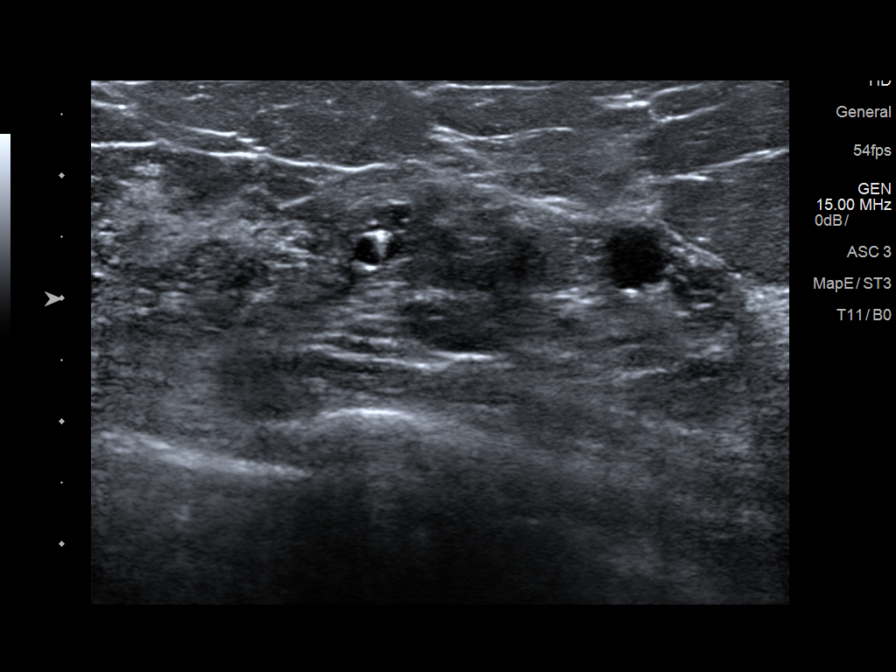
[im 2/9]
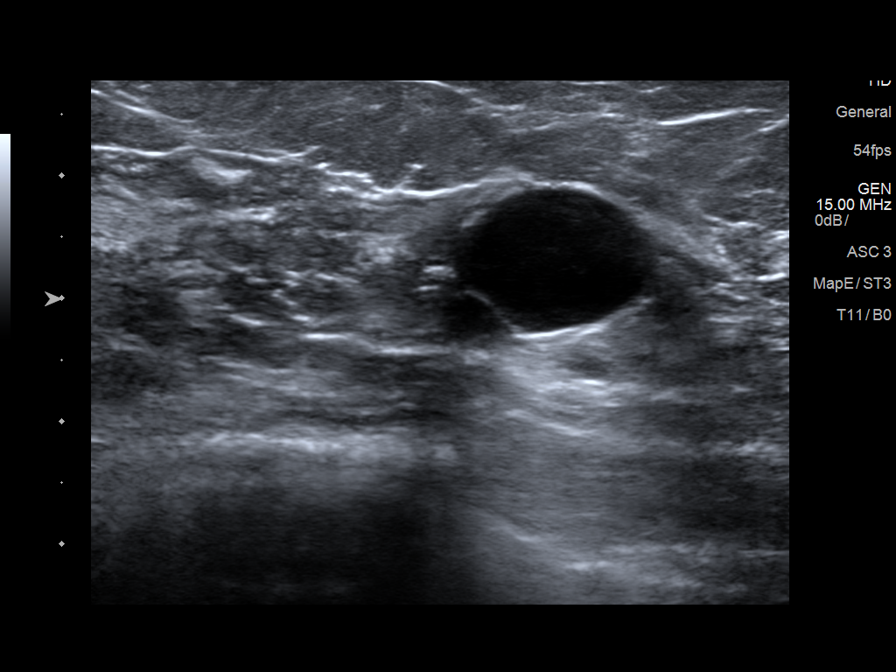
[im 3/9]
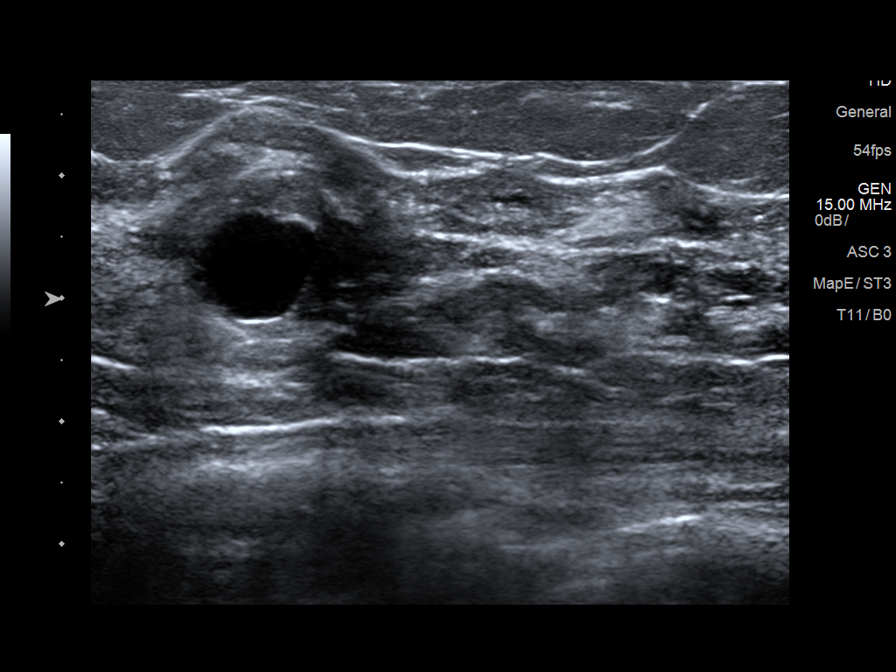
[im 4/9]
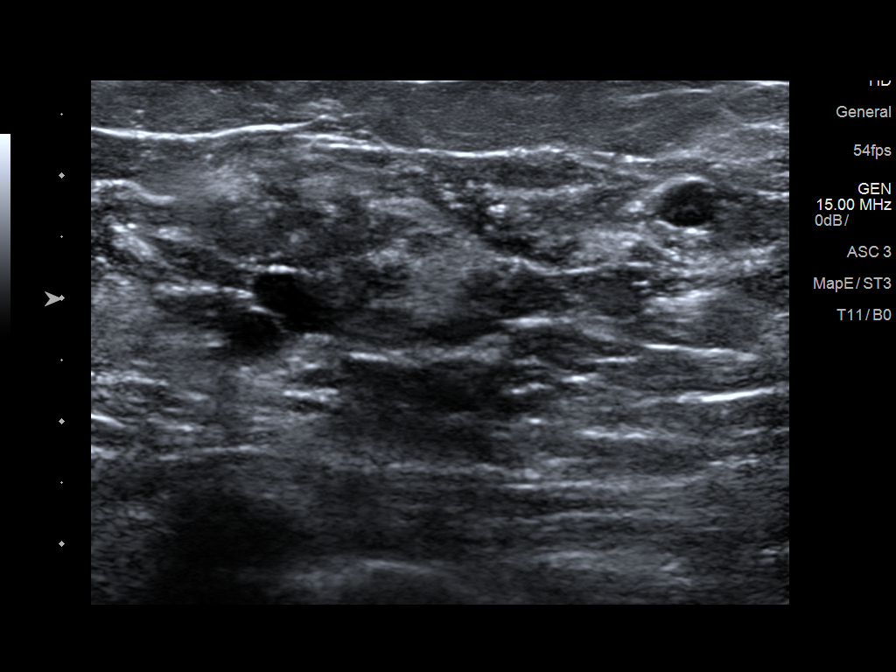
[im 5/9]
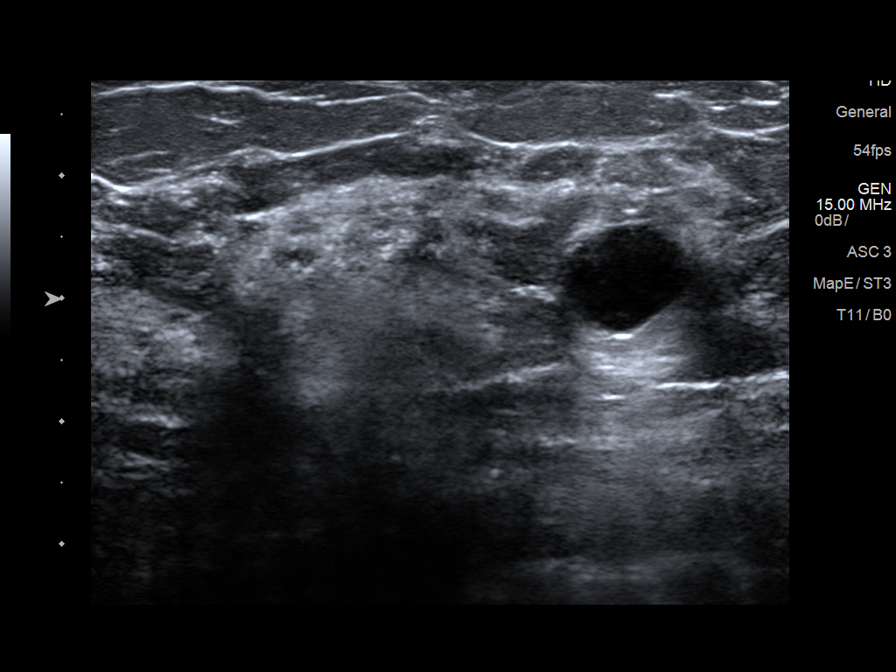
[im 6/9]
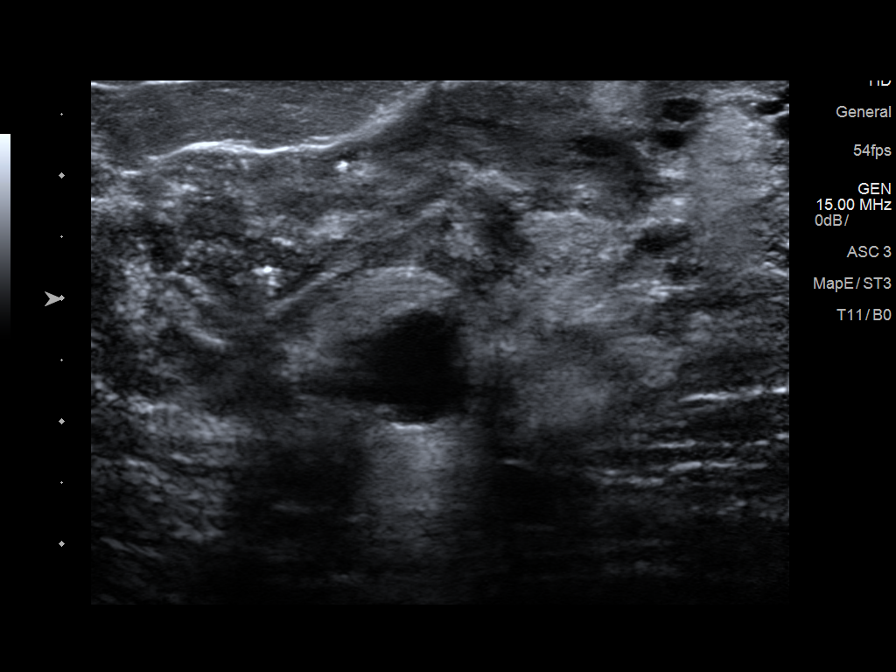
[im 7/9]
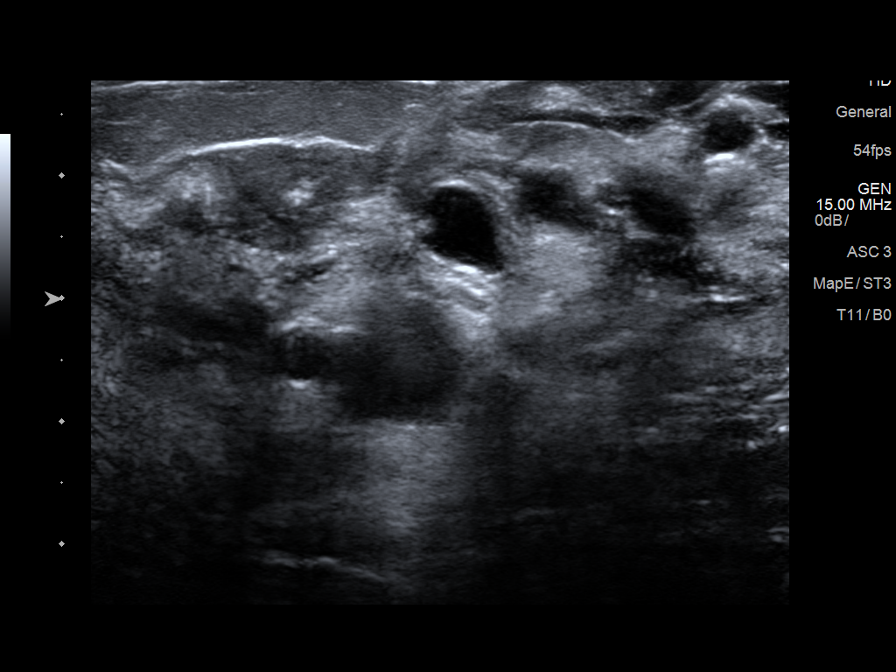
[im 8/9]
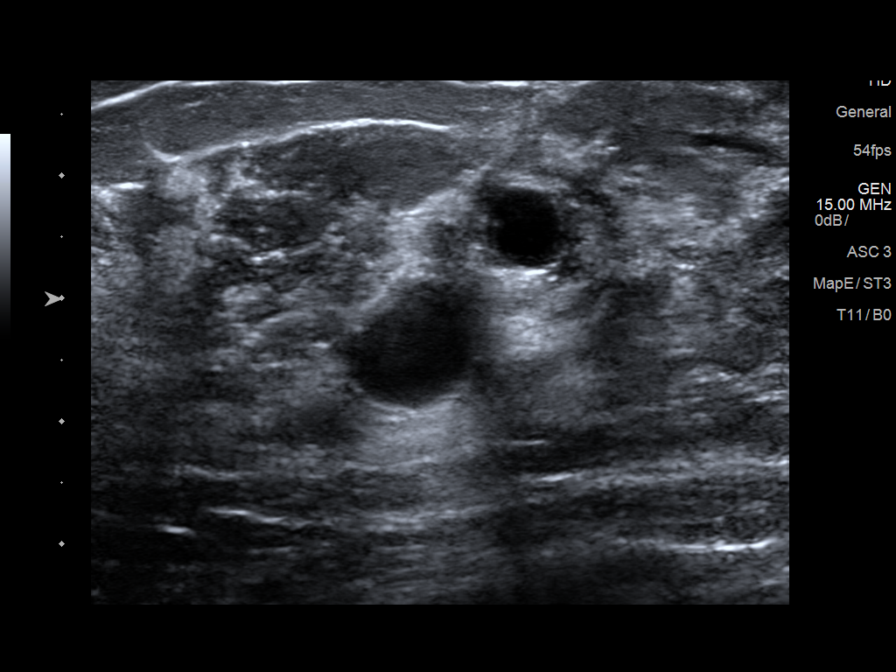
[im 9/9]
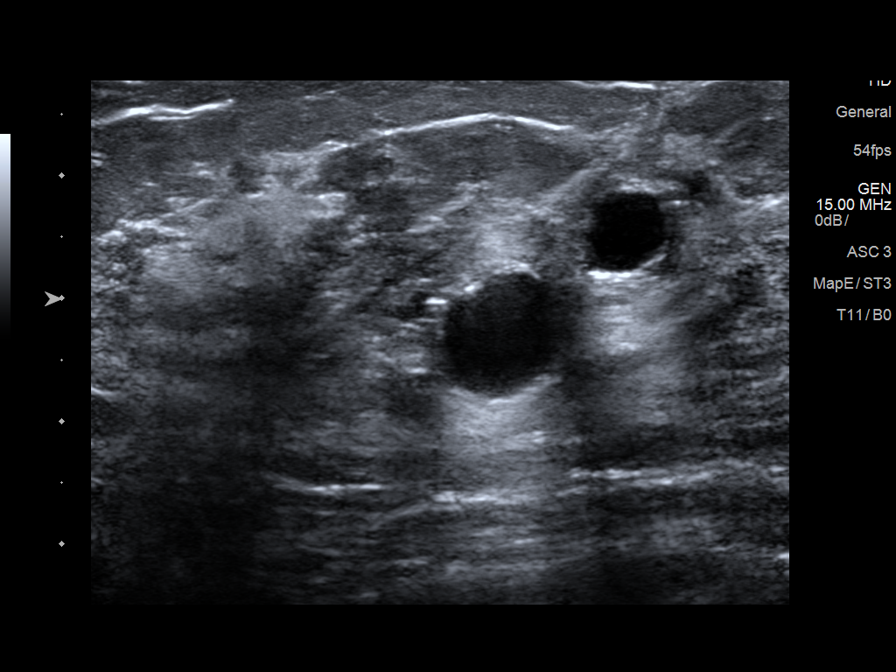

[9 of 9 positions shown; findings below may reference images not displayed]

FINDINGS: On physical exam, there are no palpable abnormalities of the lower
outer quadrant of the left breast.

Targeted ultrasound is performed, showing numerous simple cysts
between the 4 o'clock and 6 o'clock position of the left breast.
There is no evidence of mass or other suspicious sonographic
finding.
IMPRESSION: Simple cysts throughout the lower outer quadrant of the left breast.
No sonographic correlates for the area of concern on MRI.

RECOMMENDATION:
MRI guided core needle biopsy of the 2 adjacent masses associated
with third tiny focus of enhancement in the lower outer quadrant of
the left breast

I have discussed the findings and recommendations with the patient.
Results were also provided in writing at the conclusion of the
visit. If applicable, a reminder letter will be sent to the patient
regarding the next appointment.

BI-RADS CATEGORY  4: Suspicious abnormality - biopsy should be
considered.
